# Patient Record
Sex: Female | Born: 1935 | Race: White | Hispanic: No | Marital: Married | Smoking: Never smoker
Health system: Southern US, Community
[De-identification: ages and names within clinical notes are randomized; demographics above are authoritative.]

## PROBLEM LIST (undated history)

## (undated) DIAGNOSIS — I639 Cerebral infarction, unspecified: Secondary | ICD-10-CM

## (undated) DIAGNOSIS — I099 Rheumatic heart disease, unspecified: Secondary | ICD-10-CM

## (undated) DIAGNOSIS — C189 Malignant neoplasm of colon, unspecified: Secondary | ICD-10-CM

## (undated) DIAGNOSIS — M1990 Unspecified osteoarthritis, unspecified site: Secondary | ICD-10-CM

## (undated) DIAGNOSIS — Z952 Presence of prosthetic heart valve: Secondary | ICD-10-CM

## (undated) DIAGNOSIS — K219 Gastro-esophageal reflux disease without esophagitis: Secondary | ICD-10-CM

## (undated) DIAGNOSIS — I509 Heart failure, unspecified: Secondary | ICD-10-CM

## (undated) DIAGNOSIS — I4891 Unspecified atrial fibrillation: Secondary | ICD-10-CM

## (undated) DIAGNOSIS — G20 Parkinson's disease: Secondary | ICD-10-CM

## (undated) DIAGNOSIS — I10 Essential (primary) hypertension: Secondary | ICD-10-CM

## (undated) DIAGNOSIS — I272 Pulmonary hypertension, unspecified: Secondary | ICD-10-CM

## (undated) HISTORY — DX: Malignant neoplasm of colon, unspecified: C18.9

## (undated) HISTORY — PX: TRANSVERSE COLON RESECTION: SHX6155

## (undated) HISTORY — PX: MITRAL VALVE REPLACEMENT: SHX147

---

## 2007-05-15 ENCOUNTER — Ambulatory Visit: Payer: Self-pay | Admitting: Emergency Medicine

## 2007-05-16 ENCOUNTER — Emergency Department: Payer: Self-pay | Admitting: Emergency Medicine

## 2009-05-23 ENCOUNTER — Ambulatory Visit: Payer: Self-pay | Admitting: Gastroenterology

## 2009-05-30 ENCOUNTER — Ambulatory Visit: Payer: Self-pay | Admitting: Gastroenterology

## 2011-06-12 ENCOUNTER — Ambulatory Visit: Payer: Self-pay | Admitting: Internal Medicine

## 2011-06-12 LAB — CBC CANCER CENTER
Basophil %: 0 %
Eosinophil #: 0.1 x10 3/mm (ref 0.0–0.7)
Eosinophil %: 2.5 %
HCT: 32.9 % — ABNORMAL LOW (ref 35.0–47.0)
Lymphocyte #: 0.6 x10 3/mm — ABNORMAL LOW (ref 1.0–3.6)
Lymphocyte %: 18.2 %
MCH: 29.3 pg (ref 26.0–34.0)
MCHC: 32.5 g/dL (ref 32.0–36.0)
MCV: 90 fL (ref 80–100)
Monocyte #: 0.4 x10 3/mm (ref 0.0–0.7)
Neutrophil #: 2.4 x10 3/mm (ref 1.4–6.5)
Neutrophil %: 69.1 %
RBC: 3.66 10*6/uL — ABNORMAL LOW (ref 3.80–5.20)
WBC: 3.5 x10 3/mm — ABNORMAL LOW (ref 3.6–11.0)

## 2011-06-12 LAB — SEDIMENTATION RATE: Erythrocyte Sed Rate: 46 mm/hr — ABNORMAL HIGH (ref 0–30)

## 2011-06-12 LAB — FOLATE: Folic Acid: 18.7 ng/mL (ref 3.1–100.0)

## 2011-06-12 LAB — RETICULOCYTES
Absolute Retic Count: 0.089 10*6/uL — ABNORMAL HIGH (ref 0.024–0.084)
Reticulocyte: 2.4 % — ABNORMAL HIGH (ref 0.5–1.5)

## 2011-06-12 LAB — IRON AND TIBC
Iron Saturation: 93 %
Unbound Iron-Bind.Cap.: 26 ug/dL

## 2011-06-13 LAB — URINE IEP, RANDOM

## 2011-06-13 LAB — PROT IMMUNOELECTROPHORES(ARMC)

## 2011-06-25 ENCOUNTER — Ambulatory Visit: Payer: Self-pay | Admitting: Internal Medicine

## 2011-07-25 ENCOUNTER — Ambulatory Visit: Payer: Self-pay | Admitting: Internal Medicine

## 2011-09-13 ENCOUNTER — Other Ambulatory Visit: Payer: Self-pay | Admitting: Rheumatology

## 2011-09-18 LAB — CULTURE, BLOOD (SINGLE)

## 2011-09-20 ENCOUNTER — Ambulatory Visit: Payer: Self-pay | Admitting: Internal Medicine

## 2011-09-20 LAB — CBC CANCER CENTER
Eosinophil #: 0.1 x10 3/mm (ref 0.0–0.7)
Eosinophil %: 0.8 %
MCH: 26.7 pg (ref 26.0–34.0)
Monocyte %: 9.5 %
Platelet: 440 x10 3/mm (ref 150–440)
RBC: 3.58 10*6/uL — ABNORMAL LOW (ref 3.80–5.20)
RDW: 15.3 % — ABNORMAL HIGH (ref 11.5–14.5)

## 2011-09-20 LAB — RETICULOCYTES
Absolute Retic Count: 0.075 10*6/uL (ref 0.024–0.084)
Reticulocyte: 2.1 % — ABNORMAL HIGH (ref 0.5–1.5)

## 2011-09-20 LAB — IRON AND TIBC
Iron Saturation: 7 %
Iron: 18 ug/dL — ABNORMAL LOW (ref 50–170)

## 2011-09-21 LAB — PROT IMMUNOELECTROPHORES(ARMC)

## 2011-09-24 ENCOUNTER — Ambulatory Visit: Payer: Self-pay | Admitting: Internal Medicine

## 2011-10-08 LAB — OCCULT BLOOD X 1 CARD TO LAB, STOOL
Occult Blood, Feces: NEGATIVE
Occult Blood, Feces: NEGATIVE

## 2011-10-25 ENCOUNTER — Ambulatory Visit: Payer: Self-pay | Admitting: Internal Medicine

## 2011-11-02 LAB — CBC CANCER CENTER
Basophil #: 0 x10 3/mm (ref 0.0–0.1)
Eosinophil #: 0.1 x10 3/mm (ref 0.0–0.7)
Lymphocyte %: 15.8 %
MCHC: 32.7 g/dL (ref 32.0–36.0)
Monocyte %: 7.4 %
Neutrophil %: 73.1 %
Platelet: 362 x10 3/mm (ref 150–440)
RDW: 21.6 % — ABNORMAL HIGH (ref 11.5–14.5)
WBC: 4.8 x10 3/mm (ref 3.6–11.0)

## 2011-11-02 LAB — IRON AND TIBC
Iron Bind.Cap.(Total): 287 ug/dL (ref 250–450)
Iron: 36 ug/dL — ABNORMAL LOW (ref 50–170)
Unbound Iron-Bind.Cap.: 251 ug/dL

## 2011-11-25 ENCOUNTER — Ambulatory Visit: Payer: Self-pay | Admitting: Internal Medicine

## 2012-01-03 ENCOUNTER — Ambulatory Visit: Payer: Self-pay | Admitting: Internal Medicine

## 2012-01-03 LAB — CBC CANCER CENTER
Eosinophil #: 0.1 x10 3/mm (ref 0.0–0.7)
Eosinophil %: 1.7 %
HCT: 32.4 % — ABNORMAL LOW (ref 35.0–47.0)
HGB: 10.3 g/dL — ABNORMAL LOW (ref 12.0–16.0)
Lymphocyte %: 13.7 %
MCHC: 31.9 g/dL — ABNORMAL LOW (ref 32.0–36.0)
Monocyte %: 10.1 %
Neutrophil %: 73.8 %

## 2012-01-03 LAB — IRON AND TIBC
Iron Bind.Cap.(Total): 292 ug/dL (ref 250–450)
Iron Saturation: 16 %
Unbound Iron-Bind.Cap.: 246 ug/dL

## 2012-01-03 LAB — FERRITIN: Ferritin (ARMC): 35 ng/mL (ref 8–388)

## 2012-01-25 ENCOUNTER — Ambulatory Visit: Payer: Self-pay | Admitting: Internal Medicine

## 2012-03-27 ENCOUNTER — Ambulatory Visit: Payer: Self-pay | Admitting: Internal Medicine

## 2012-03-27 LAB — CBC CANCER CENTER
Basophil #: 0 x10 3/mm (ref 0.0–0.1)
Eosinophil #: 0.1 x10 3/mm (ref 0.0–0.7)
Eosinophil %: 1.4 %
HCT: 29.7 % — ABNORMAL LOW (ref 35.0–47.0)
HGB: 9.9 g/dL — ABNORMAL LOW (ref 12.0–16.0)
Lymphocyte #: 0.6 x10 3/mm — ABNORMAL LOW (ref 1.0–3.6)
MCH: 30.3 pg (ref 26.0–34.0)
MCHC: 33.5 g/dL (ref 32.0–36.0)
MCV: 90 fL (ref 80–100)
Neutrophil #: 3.2 x10 3/mm (ref 1.4–6.5)
Platelet: 244 x10 3/mm (ref 150–440)
RDW: 18.1 % — ABNORMAL HIGH (ref 11.5–14.5)
WBC: 4.3 x10 3/mm (ref 3.6–11.0)

## 2012-03-27 LAB — IRON AND TIBC
Iron: 75 ug/dL (ref 50–170)
Unbound Iron-Bind.Cap.: 174 ug/dL

## 2012-03-27 LAB — FERRITIN: Ferritin (ARMC): 49 ng/mL (ref 8–388)

## 2012-04-26 ENCOUNTER — Ambulatory Visit: Payer: Self-pay | Admitting: Internal Medicine

## 2012-06-19 ENCOUNTER — Ambulatory Visit: Payer: Self-pay | Admitting: Internal Medicine

## 2012-06-19 LAB — CBC CANCER CENTER
Basophil #: 0 x10 3/mm (ref 0.0–0.1)
Basophil %: 0.5 %
Eosinophil #: 0.1 x10 3/mm (ref 0.0–0.7)
Eosinophil %: 2 %
HGB: 9.6 g/dL — ABNORMAL LOW (ref 12.0–16.0)
Lymphocyte #: 0.6 x10 3/mm — ABNORMAL LOW (ref 1.0–3.6)
Lymphocyte %: 13 %
MCH: 29.8 pg (ref 26.0–34.0)
MCHC: 31.8 g/dL — ABNORMAL LOW (ref 32.0–36.0)
MCV: 94 fL (ref 80–100)
Monocyte %: 8.4 %
Platelet: 255 x10 3/mm (ref 150–440)
RDW: 15.8 % — ABNORMAL HIGH (ref 11.5–14.5)

## 2012-06-19 LAB — FERRITIN: Ferritin (ARMC): 31 ng/mL (ref 8–388)

## 2012-06-19 LAB — IRON AND TIBC
Iron: 49 ug/dL — ABNORMAL LOW (ref 50–170)
Unbound Iron-Bind.Cap.: 218 ug/dL

## 2012-06-24 ENCOUNTER — Ambulatory Visit: Payer: Self-pay | Admitting: Internal Medicine

## 2012-10-04 ENCOUNTER — Inpatient Hospital Stay: Payer: Self-pay | Admitting: Internal Medicine

## 2012-10-04 LAB — URINALYSIS, COMPLETE
Glucose,UR: NEGATIVE mg/dL (ref 0–75)
Ketone: NEGATIVE
Leukocyte Esterase: NEGATIVE
Nitrite: POSITIVE
Ph: 5 (ref 4.5–8.0)
Protein: NEGATIVE
Specific Gravity: 1.011 (ref 1.003–1.030)
Squamous Epithelial: <1

## 2012-10-04 LAB — COMPREHENSIVE METABOLIC PANEL
BUN: 15 mg/dL (ref 7–18)
Bilirubin,Total: 0.4 mg/dL (ref 0.2–1.0)
Calcium, Total: 8.5 mg/dL (ref 8.5–10.1)
EGFR (African American): >60
Osmolality: 278 (ref 275–301)
Potassium: 3.5 mmol/L (ref 3.5–5.1)
SGOT(AST): 20 U/L (ref 15–37)
Sodium: 139 mmol/L (ref 136–145)

## 2012-10-04 LAB — APTT
Activated PTT: 36.8 secs — ABNORMAL HIGH (ref 23.6–35.9)
Activated PTT: 95.5 secs — ABNORMAL HIGH (ref 23.6–35.9)

## 2012-10-04 LAB — CBC
MCH: 30.1 pg (ref 26.0–34.0)
WBC: 3.6 10*3/uL (ref 3.6–11.0)

## 2012-10-04 LAB — PROTIME-INR
INR: 1.4
Prothrombin Time: 17.3 secs — ABNORMAL HIGH (ref 11.5–14.7)

## 2012-10-05 LAB — BASIC METABOLIC PANEL
Anion Gap: 0 — ABNORMAL LOW (ref 7–16)
Chloride: 107 mmol/L (ref 98–107)
Co2: 34 mmol/L — ABNORMAL HIGH (ref 21–32)
EGFR (African American): >60
Glucose: 85 mg/dL (ref 65–99)
Osmolality: 281 (ref 275–301)
Sodium: 141 mmol/L (ref 136–145)

## 2012-10-05 LAB — PROTIME-INR
INR: 1.5
Prothrombin Time: 18.3 secs — ABNORMAL HIGH (ref 11.5–14.7)

## 2012-10-05 LAB — CBC WITH DIFFERENTIAL/PLATELET
Basophil #: 0 10*3/uL (ref 0.0–0.1)
Basophil %: 0.8 %
Eosinophil #: 0.2 10*3/uL (ref 0.0–0.7)
HCT: 33.1 % — ABNORMAL LOW (ref 35.0–47.0)
HGB: 11.2 g/dL — ABNORMAL LOW (ref 12.0–16.0)
Lymphocyte #: 0.8 10*3/uL — ABNORMAL LOW (ref 1.0–3.6)
MCH: 30.4 pg (ref 26.0–34.0)
Monocyte #: 0.5 x10 3/mm (ref 0.2–0.9)
Neutrophil #: 2.7 10*3/uL (ref 1.4–6.5)
Neutrophil %: 65.2 %
Platelet: 215 10*3/uL (ref 150–440)
RBC: 3.67 10*6/uL — ABNORMAL LOW (ref 3.80–5.20)
RDW: 14.3 % (ref 11.5–14.5)
WBC: 4.2 10*3/uL (ref 3.6–11.0)

## 2012-10-05 LAB — LIPID PANEL
Cholesterol: 114 mg/dL (ref 0–200)
HDL Cholesterol: 44 mg/dL (ref 40–60)
Ldl Cholesterol, Calc: 61 mg/dL (ref 0–100)
Triglycerides: 46 mg/dL (ref 0–200)
VLDL Cholesterol, Calc: 9 mg/dL (ref 5–40)

## 2012-10-05 LAB — APTT
Activated PTT: 128.3 secs — ABNORMAL HIGH (ref 23.6–35.9)
Activated PTT: 74 secs — ABNORMAL HIGH (ref 23.6–35.9)

## 2012-10-06 LAB — PLATELET COUNT: Platelet: 212 10*3/uL (ref 150–440)

## 2012-10-06 LAB — APTT: Activated PTT: 77.3 secs — ABNORMAL HIGH (ref 23.6–35.9)

## 2012-10-06 LAB — PROTIME-INR: INR: 1.7

## 2012-10-06 LAB — URINE CULTURE

## 2012-10-06 LAB — HEMOGLOBIN: HGB: 11 g/dL — ABNORMAL LOW (ref 12.0–16.0)

## 2012-10-07 LAB — PROTIME-INR
INR: 1.8
INR: 2
Prothrombin Time: 22.3 secs — ABNORMAL HIGH (ref 11.5–14.7)

## 2012-10-07 LAB — APTT: Activated PTT: 95.2 secs — ABNORMAL HIGH (ref 23.6–35.9)

## 2012-12-25 ENCOUNTER — Ambulatory Visit: Payer: Self-pay | Admitting: Internal Medicine

## 2012-12-25 LAB — CBC CANCER CENTER
Basophil #: 0 x10 3/mm (ref 0.0–0.1)
Eosinophil #: 0.1 x10 3/mm (ref 0.0–0.7)
HCT: 32.2 % — ABNORMAL LOW (ref 35.0–47.0)
HGB: 10.4 g/dL — ABNORMAL LOW (ref 12.0–16.0)
Lymphocyte #: 0.7 x10 3/mm — ABNORMAL LOW (ref 1.0–3.6)
Lymphocyte %: 16.7 %
MCHC: 32.4 g/dL (ref 32.0–36.0)
MCV: 94 fL (ref 80–100)
Monocyte #: 0.4 x10 3/mm (ref 0.2–0.9)
Monocyte %: 10.6 %
Neutrophil #: 2.9 x10 3/mm (ref 1.4–6.5)
Platelet: 264 x10 3/mm (ref 150–440)
RDW: 17.2 % — ABNORMAL HIGH (ref 11.5–14.5)
WBC: 4.2 x10 3/mm (ref 3.6–11.0)

## 2012-12-25 LAB — IRON AND TIBC
Iron Saturation: 42 %
Unbound Iron-Bind.Cap.: 149 ug/dL

## 2012-12-26 LAB — URINE IEP, RANDOM

## 2012-12-29 LAB — PROT IMMUNOELECTROPHORES(ARMC)

## 2013-01-24 ENCOUNTER — Ambulatory Visit: Payer: Self-pay | Admitting: Internal Medicine

## 2013-06-25 ENCOUNTER — Ambulatory Visit: Payer: Self-pay | Admitting: Oncology

## 2013-06-25 LAB — CBC CANCER CENTER
Basophil #: 0 x10 3/mm (ref 0.0–0.1)
Basophil %: 0.5 %
EOS PCT: 2.2 %
Eosinophil #: 0.1 x10 3/mm (ref 0.0–0.7)
HCT: 29.2 % — AB (ref 35.0–47.0)
HGB: 9.4 g/dL — AB (ref 12.0–16.0)
Lymphocyte #: 0.8 x10 3/mm — ABNORMAL LOW (ref 1.0–3.6)
Lymphocyte %: 25.5 %
MCH: 30.6 pg (ref 26.0–34.0)
MCHC: 32.3 g/dL (ref 32.0–36.0)
MCV: 95 fL (ref 80–100)
MONO ABS: 0.3 x10 3/mm (ref 0.2–0.9)
Monocyte %: 9.8 %
Neutrophil #: 1.8 x10 3/mm (ref 1.4–6.5)
Neutrophil %: 62 %
Platelet: 262 x10 3/mm (ref 150–440)
RBC: 3.08 10*6/uL — ABNORMAL LOW (ref 3.80–5.20)
RDW: 15.4 % — ABNORMAL HIGH (ref 11.5–14.5)
WBC: 3 x10 3/mm — ABNORMAL LOW (ref 3.6–11.0)

## 2013-06-25 LAB — IRON AND TIBC
IRON SATURATION: 41 %
IRON: 112 ug/dL (ref 50–170)
Iron Bind.Cap.(Total): 272 ug/dL (ref 250–450)
Unbound Iron-Bind.Cap.: 160 ug/dL

## 2013-06-25 LAB — FERRITIN: Ferritin (ARMC): 28 ng/mL (ref 8–388)

## 2013-07-24 ENCOUNTER — Ambulatory Visit: Payer: Self-pay | Admitting: Internal Medicine

## 2013-07-24 ENCOUNTER — Ambulatory Visit: Payer: Self-pay | Admitting: Oncology

## 2013-10-08 ENCOUNTER — Ambulatory Visit: Payer: Self-pay | Admitting: Internal Medicine

## 2013-10-08 LAB — CBC CANCER CENTER
BASOS ABS: 0 x10 3/mm (ref 0.0–0.1)
Basophil %: 0.4 %
Eosinophil #: 0 x10 3/mm (ref 0.0–0.7)
Eosinophil %: 0.7 %
HCT: 33.2 % — AB (ref 35.0–47.0)
HGB: 10.8 g/dL — ABNORMAL LOW (ref 12.0–16.0)
LYMPHS PCT: 6.8 %
Lymphocyte #: 0.4 x10 3/mm — ABNORMAL LOW (ref 1.0–3.6)
MCH: 30.8 pg (ref 26.0–34.0)
MCHC: 32.4 g/dL (ref 32.0–36.0)
MCV: 95 fL (ref 80–100)
MONOS PCT: 6.9 %
Monocyte #: 0.4 x10 3/mm (ref 0.2–0.9)
Neutrophil #: 4.9 x10 3/mm (ref 1.4–6.5)
Neutrophil %: 85.2 %
PLATELETS: 274 x10 3/mm (ref 150–440)
RBC: 3.5 10*6/uL — AB (ref 3.80–5.20)
RDW: 14.7 % — ABNORMAL HIGH (ref 11.5–14.5)
WBC: 5.8 x10 3/mm (ref 3.6–11.0)

## 2013-10-12 LAB — PROT IMMUNOELECTROPHORES(ARMC)

## 2013-10-12 LAB — KAPPA/LAMBDA FREE LIGHT CHAINS (ARMC)

## 2013-10-24 ENCOUNTER — Ambulatory Visit: Payer: Self-pay | Admitting: Internal Medicine

## 2013-11-18 ENCOUNTER — Encounter: Payer: Self-pay | Admitting: Surgery

## 2013-11-24 ENCOUNTER — Encounter: Payer: Self-pay | Admitting: Surgery

## 2013-12-24 ENCOUNTER — Ambulatory Visit: Payer: Self-pay | Admitting: Internal Medicine

## 2013-12-24 LAB — CBC CANCER CENTER
BASOS ABS: 0 x10 3/mm (ref 0.0–0.1)
Basophil %: 0.7 %
Eosinophil #: 0.1 x10 3/mm (ref 0.0–0.7)
Eosinophil %: 1.4 %
HCT: 32.8 % — AB (ref 35.0–47.0)
HGB: 10.7 g/dL — ABNORMAL LOW (ref 12.0–16.0)
LYMPHS ABS: 0.5 x10 3/mm — AB (ref 1.0–3.6)
Lymphocyte %: 15.4 %
MCH: 30.9 pg (ref 26.0–34.0)
MCHC: 32.7 g/dL (ref 32.0–36.0)
MCV: 94 fL (ref 80–100)
Monocyte #: 0.3 x10 3/mm (ref 0.2–0.9)
Monocyte %: 8.1 %
NEUTROS PCT: 74.4 %
Neutrophil #: 2.6 x10 3/mm (ref 1.4–6.5)
Platelet: 342 x10 3/mm (ref 150–440)
RBC: 3.47 10*6/uL — ABNORMAL LOW (ref 3.80–5.20)
RDW: 15.3 % — ABNORMAL HIGH (ref 11.5–14.5)
WBC: 3.5 x10 3/mm — ABNORMAL LOW (ref 3.6–11.0)

## 2013-12-24 LAB — IRON AND TIBC
Iron Bind.Cap.(Total): 246 ug/dL — ABNORMAL LOW (ref 250–450)
Iron Saturation: 24 %
Iron: 60 ug/dL (ref 50–170)
Unbound Iron-Bind.Cap.: 186 ug/dL

## 2013-12-24 LAB — FERRITIN: Ferritin (ARMC): 34 ng/mL (ref 8–388)

## 2013-12-28 LAB — PROT IMMUNOELECTROPHORES(ARMC)

## 2013-12-29 LAB — URINE IEP, RANDOM

## 2014-01-24 ENCOUNTER — Ambulatory Visit: Payer: Self-pay | Admitting: Internal Medicine

## 2014-04-01 ENCOUNTER — Ambulatory Visit: Payer: Self-pay | Admitting: Internal Medicine

## 2014-04-01 LAB — CBC CANCER CENTER
BASOS ABS: 0 x10 3/mm (ref 0.0–0.1)
Basophil %: 0.7 %
Eosinophil #: 0.1 x10 3/mm (ref 0.0–0.7)
Eosinophil %: 2.1 %
HCT: 31.6 % — ABNORMAL LOW (ref 35.0–47.0)
HGB: 10 g/dL — ABNORMAL LOW (ref 12.0–16.0)
LYMPHS PCT: 16.6 %
Lymphocyte #: 0.8 x10 3/mm — ABNORMAL LOW (ref 1.0–3.6)
MCH: 30.2 pg (ref 26.0–34.0)
MCHC: 31.6 g/dL — ABNORMAL LOW (ref 32.0–36.0)
MCV: 95 fL (ref 80–100)
MONOS PCT: 9.6 %
Monocyte #: 0.4 x10 3/mm (ref 0.2–0.9)
NEUTROS ABS: 3.3 x10 3/mm (ref 1.4–6.5)
Neutrophil %: 71 %
PLATELETS: 292 x10 3/mm (ref 150–440)
RBC: 3.32 10*6/uL — ABNORMAL LOW (ref 3.80–5.20)
RDW: 14.2 % (ref 11.5–14.5)
WBC: 4.6 x10 3/mm (ref 3.6–11.0)

## 2014-04-26 ENCOUNTER — Ambulatory Visit: Payer: Self-pay | Admitting: Internal Medicine

## 2014-07-01 ENCOUNTER — Ambulatory Visit
Admit: 2014-07-01 | Disposition: A | Discharge status: Home / Self Care | Payer: Self-pay | Attending: Internal Medicine | Admitting: Internal Medicine

## 2014-07-01 LAB — IRON AND TIBC
IRON BIND. CAP.(TOTAL): 314 (ref 250–450)
Iron Saturation: 11.4
Iron: 36 ug/dL
Unbound Iron-Bind.Cap.: 278.4

## 2014-07-01 LAB — CBC CANCER CENTER
Basophil #: 0 x10 3/mm (ref 0.0–0.1)
Basophil %: 0.8 %
EOS PCT: 3.6 %
Eosinophil #: 0.1 x10 3/mm (ref 0.0–0.7)
HCT: 30.1 % — ABNORMAL LOW (ref 35.0–47.0)
HGB: 9.7 g/dL — AB (ref 12.0–16.0)
Lymphocyte #: 0.6 x10 3/mm — ABNORMAL LOW (ref 1.0–3.6)
Lymphocyte %: 17 %
MCH: 28.3 pg (ref 26.0–34.0)
MCHC: 32.1 g/dL (ref 32.0–36.0)
MCV: 88 fL (ref 80–100)
MONO ABS: 0.4 x10 3/mm (ref 0.2–0.9)
Monocyte %: 11.1 %
NEUTROS ABS: 2.3 x10 3/mm (ref 1.4–6.5)
NEUTROS PCT: 67.5 %
Platelet: 278 x10 3/mm (ref 150–440)
RBC: 3.42 10*6/uL — ABNORMAL LOW (ref 3.80–5.20)
RDW: 16.1 % — ABNORMAL HIGH (ref 11.5–14.5)
WBC: 3.4 x10 3/mm — ABNORMAL LOW (ref 3.6–11.0)

## 2014-07-01 LAB — FERRITIN: Ferritin (ARMC): 22 ng/mL

## 2014-07-02 LAB — KAPPA/LAMBDA FREE LIGHT CHAINS (ARMC)

## 2014-07-16 NOTE — Consult Note (Signed)
Brief Consult Note: Diagnosis: Heme positive, chroic IDA w Hgb 11 in need of anticoagulation.   Patient was seen by consultant.   Comments: This case was d/w Dr. Vira Agar in collaboration of care. With  a hgb of 11-stable- and no frank rectal bleeding or hematemesis would not let a heme positive test result deter from using anticoagulation. Recommend trying  to keep the INR in the lowest effective range as needed. In the absence of active gi bleed, NO plans for luminal evaluation given poor overall health.  Electronic Signatures: Gershon Mussel (NP)  (Signed 14-Jul-14 13:44)  Authored: Brief Consult Note   Last Updated: 14-Jul-14 13:44 by Gershon Mussel (NP)

## 2014-07-16 NOTE — Discharge Summary (Signed)
PATIENT NAME:  Gwendolyn Norman, Gwendolyn Norman MR#:  [id number] DATE OF BIRTH:  08/18/1931  DATE OF ADMISSION:  10/04/2012 DATE OF DISCHARGE:  10/07/2012  ADMITTING DIAGNOSES:  1.  Cerebrovascular accident. 2.  Malignant hypertension.   DISCHARGE DIAGNOSES:  1.  Hypertensive encephalopathy versus transient ischemic attack with isolated left eye third nerve palsy, resolved.  2.  History of lazy eye.  3.  Malignant hypertension.  4.  Urinary tract infection. 5.  Anemia, guaiac positive.  6.  Chronic gastrointestinal blood loss.  7.  History of atrial fibrillation, mechanical mitral valve, on Coumadin therapy.  8.  Hematuria.   DISCHARGE CONDITION:  Stable.   DISCHARGE MEDICATIONS:  The patient is to continue metoprolol tartrate 50 mg by p.o. twice daily, amlodipine 5 mg p.o. daily, vitamin B12 1000 mcg p.o. daily, Tylenol 500 mg p.o. daily as needed, Coumadin 5 mg 1/2 tablet daily, iron sulfate 325 mg p.o. 3 times daily, folic acid 1 mg p.o. once daily, calcium 600 with vitamin D 200 units 1 tablet twice daily, hydrochloroquine 200 mg p.o. daily, Lasix 20 mg p.o. daily, lisinopril 5 mg p.o. twice daily, pantoprazole 40 mg p.o. twice daily, ciprofloxacin 500 mg p.o. twice daily for 2 more days.   HOME OXYGEN:  None.   DIET:  2 gram salt, low fat, low cholesterol, regular consistency.   ACTIVITY LIMITATIONS:  As tolerated.    FOLLOW-UP APPOINTMENT:  With Dr. Ilene Qua in 2 days after discharge.  The patient was advised also to have Coumadin level checked in the next 2 or 3 days after discharge.   CONSULTANTS:  Care management, Dr. Vira Agar.  Ms. Denice Paradise, nurse practitioner.   RADIOLOGIC STUDIES:  Chest, portable single view, 10/04/2012, revealed massive cardiomegaly with possible mild interstitial edema versus fibrosis.  Follow-up PA and lateral chest views were recommended.  CT scan of head without contrast 10/04/2012 showed a right basal ganglia lacunar infarct in the thalamus.  No acute  intracranial abnormality evident.  MRI of brain without contrast 10/04/2012 revealed atrophy with chronic microvascular ischemic disease.  No acute intracranial abnormality.  Old right thalamic lacunar infarct present.  Carotid ultrasound, bilateral, 10/04/2012 revealed atherosclerotic disease without evidence of hemodynamically significant stenosis.  Echocardiogram 10/04/2012 showed a left ventricular ejection fraction by visual estimation 55% to 60%, normal global left ventricular systolic function, mild concentric left ventricular hypertrophy, mildly increased left ventricular septal thickness, moderately dilated left atrium, mildly dilated right atrium, mild mitral valve regurgitation, mitral prosthesis is functioning normally, mild aortic regurgitation, mild aortic valve sclerosis without stenosis, mildly elevated pulmonary arterial systolic pressure, mildly increased left ventricular posterior wall thickness, mild tricuspid regurgitation.   HOSPITAL COURSE:   1.  The patient is a 81 year old Caucasian female with past medical history significant for history of mitral valve replacement, history of other medical problems including anemia of chronic disease, iron deficiency anemia, rheumatoid arthritis, history of MGUS, leukopenia, presents to the hospital with complaints of inability to walk and feeling dizzy, lightheaded as well as having vision problems, double vision, especially when she looks to the right side.  Please refer to Dr. Keenan Bachelor admission note on 10/04/2012.  On arrival to the Emergency Room, the patient's vitals revealed normal temperature of 98.3, pulse was 75, respiration rate was 20, blood pressure was 172/93, O2 sats were 100% on room air.  Physical exam revealed left eye 3rd nerve palsy.  The patient would have skew deviation whenever she would look to the right; her left eye would not cross  the midline and would not converge.    She was admitted to the hospital with diagnosis of  stroke and stroke workup was initiated.  It was felt that patient very likely had stroke, however MRI did not show a stroke so it was felt that the patient could have had a TIA as the patient's symptoms really subsided in the next 24 hours or hypertensive encephalopathy.  With blood pressure management, the patient's condition significantly improved.  The patient was evaluated by speech therapist as well as a physical therapist who recommended home health physical therapy.  The patient will be discharged home with Coumadin and her lipid panel was checked to rule out any other reasons for stroke.  Lipid panel revealed an LDL of 61, total cholesterol of 114, triglycerides 46 and HDL of 44.  The patient was not initiated on lipid lowering medications at this point because of concern of possible hemorrhagic conversion of stroke when the patient's LDL is very low.  According to new recommendations, this lipid management is not recommended for patients with low LDL.  She is, however, to follow up with her primary care physician and make decisions about the medications if needed.  While in the hospital she was loaded with Coumadin.  Initially it was thought that the patient had an embolic phenomenon as her INR was low.  On arrival to the Emergency Room, the patient's pro time was only 17.3 and INR was 1.4.  She was initiated on heparin IV and was loaded with Coumadin.  On day of discharge, the patient's pro time is 22.3 and INR 2.0.  She is given 5 more milligrams of Coumadin prior to leaving the hospital.  She is to resume her usual home management of her Coumadin, and she is to follow up with her primary care physician and have her Coumadin checked in the next few days after discharge.   2.  Regarding her malignant hypertension, the patient's blood pressure was poorly controlled while she was in the hospital; however adjusting her blood pressure medications got her blood pressure well controlled.  On the day of  discharge, patient's vital signs, temperature is 98.4, pulse ranging from 60s to 70s, respiratory rate was 16 to 20, blood pressure 116/63, saturation was 96% to 97% on room air at rest.  It was felt that the patient's blood pressure management has a lot to do with her encephalopathy, however TIA was not completely ruled out.   3.  The patient was also noted to have urinary tract infection.  E. coli was growing in her urine cultures, more than 100,000 colony-forming units.  The patient's E. coli was susceptible to all antibiotics.  She was initiated on ciprofloxacin.  She is to continue ciprofloxacin for 2 more days to complete a 3-day course.  4.  In regards to anemia, the patient was noted to be guaiac positive.  It was felt that the patient very likely has some chronic GI blood loss as well as blood loss with hematuria.  Gastroenterologist was consulted, however he recommended no intervention.  Iron supplementation was recommended for her upon discharge.  5.  In regards to atrial fibrillation as well as mechanical valve, as mentioned above, the patient's Coumadin was very low, level was only 1.4 on admission.  The patient was given heparin as well as Coumadin bridge while she was in the hospital, and her Coumadin level on the day of discharge is therapeutic or close to therapeutic at 2.0.  Of note, Dr.  Vira Agar felt that the patient would benefit from lower range and tighter range of INR control to avoid bleeding; however he recommended no plans for EGD or colposcopy.   TIME SPENT:  40 minutes.    ____________________________ Theodoro Grist, MD rv:ea D: 10/07/2012 21:38:00 ET T: 10/08/2012 00:03:04 ET JOB#: [id number]  cc: Fonnie Jarvis. Ilene Qua, MD Theodoro Grist, MD, <Dictator>    [place] MD ELECTRONICALLY SIGNED 10/16/2012 11:44

## 2014-07-16 NOTE — Consult Note (Signed)
PATIENT NAME:  Gwendolyn Norman, Gwendolyn Norman MR#:  [id number] DATE OF BIRTH:  1931/04/15  DATE OF CONSULTATION:  10/06/2012  REFERRING PHYSICIAN:  Theodoro Grist, MD   CONSULTING PHYSICIAN:  Gaylyn Cheers, MD/Chayna Surratt A. Jerelene Redden, ANP   REASON FOR CONSULTATION: Heme-positive stool on chronic anticoagulation with warfarin.   HISTORY OF PRESENT ILLNESS: This 81 year old patient of Dr. Ilene Qua presented to the hospital for blurred vision, dizziness, and this occurred yesterday afternoon when she got up from the couch. She felt like she was leaning and walking toward her right side. She was having headache. The patient was concerned about possibility of stroke. She presented to the Emergency Room and came in with an INR subtherapeutic at 1.4. She had been taking Coumadin 2.5 mg 3 times a week as well as 5 mg twice a week for her history of CVA and [employer]. Jude's mitral valve replacement.  In the Emergency Room, she did have a CT scan of the head without contrast on 10/04/2012 that showed right basal ganglia lacunar infarct in the thalamus. No acute intracranial abnormalities were noted. She was found to be Hemoccult positive. She has chronic iron deficiency anemia and has been on oral iron therapy and followed for IV iron supplementation by Dr. Ma Hillock for the last 2 years. Her baseline hemoglobin that is considered "good" when it is in the 10 range. The patient presents with a hemoglobin of 11. There is a request from GI for opinion on whether the patient can be continued on her chronic anticoagulation therapy in this setting of a Hemoccult positive stool and absence of any GI complaints or overt evidence of GI blood loss. The patient reports her stools are chronically black because she has been on 3 iron tablets a day, recently decreased to 2 tablets daily.    This patient reports she has been offered a colonoscopy in the past and declined. She did agree to a barium enema study and upper GI barium study 05/23/2009 that showed  tertiary contractions involving the mid and distal esophagus likely secondary to a presbyesophagus versus spasm. The barium enema was performed 05/30/2009 with air contrast and it showed no stenosis or abnormality. There was an incompetent ileocecal valve. The patient can recall a remote flexible sigmoidoscopy over 10 years ago. She has been maintained on iron therapy and is on omeprazole 20 mg once daily. The patient denies any upper or lower GI complaints and reports normal diet, appetite and weight.   PAST MEDICAL HISTORY: 1.  Atrial fibrillation.  2.  Rheumatic fever with mitral valve replacement. Initial valve surgery was 1974.  3.  CVA.  4.  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  5.  Peripheral vascular disease.  6.  Anemia and leukopenia followed by Dr. Ma Hillock. 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  7.  Esophageal tertiary contractions, presbyesophagus per upper GI barium study.  8.  Pulmonary hypertension.  9.  GERD.  PAST SURGICAL HISTORY: 1.  St. Jude's mitral valve replacement in 1992.  2.  Tonsillectomy.  3.  History of breast biopsy, negative for cancer.   MEDICATIONS ON ARRIVAL: According to patient records:  1.  Coumadin.  2.  Amlodipine 5 mg daily.  3.  Metoprolol 50 mg twice daily.  4.  Hydroxychloroquine 200 mg daily.  5.  Folic acid 1 daily.  6.  Vitamin B12 1000 mcg daily.  7.  Iron 325 mg twice daily.  8.  Furosemide 20 mg daily.  9.  Calcium with vitamin D  twice daily.  10.  Omeprazole 20 mg once daily.   ALLERGIES:  1.  DOXYCYCLINE. 2.  PENICILLIN.   REVIEW OF SYSTEMS: Positive for weakness and fatigue, headache on the left side, and weight loss reported; however, [facility] weight was 129 in 2013 and 132 05/2012.  No evidence of weight loss by clinic measurement. The patient reports normal appetite, diet and reports stable weight. She denies any abdominal pain. She has occasional heart palpitation and asymptomatic now. She has  chronic arthritis, reports that is stable. Remaining review of systems x 10 negative.   PHYSICAL EXAMINATION:  VITAL SIGNS: Temp 98.3 to 99.3, 62, 139/78, pulse oximetry room air is 96%.   GENERAL: A well-appearing elderly Caucasian female resting in bed.  HEENT: Head is normocephalic. Conjunctivae pink. Sclerae are anicteric. Oral mucosa is moist and intact.  NECK: Supple without lymphadenopathy, thyromegaly.  CARDIAC: S1, S2 without murmur, rub or gallop.  RESPIRATIONS: S1, S2 with metallic click, mitral and positive murmur consistent with heart history. Very prominent murmur. No arrhythmia noted.  LUNGS: CTA. Respirations are eupneic.  ABDOMEN: Soft. Bowel sounds are present in all quadrants. Nontender in all quadrants without hepatosplenomegaly.  RECTAL: Exam is deferred.  MUSCULOSKELETAL: No obvious joint swelling or inflammation, movement of extremities x 4. Gait not evaluated.  SKIN: Warm and dry without rash, venous stasis changes, scant, if any, edema.  Te patient reports she is at baseline.  NEUROLOGIC: Cranial nerves are grossly intact. Speech is clear, grasp is equal bilateral. She is a good historian. Gait not evaluated.  PSYCHIATRIC: Affect and mood within normal, alert and oriented x 3.   LABORATORY AND RADIOLOGICAL DATA:  Today with unremarkable MET-B.  Hemoglobin 11.2 to 11.0, normal platelet count 215, MCV 90, pro time is 18.3, INR 1.5, PTT 128.3. Occult positive.  Routine stool study.  Hemoccult positive card.  Urinalysis is positive for RBCs, WBCs, and culture. Grew E. coli greater than 100,000.  The patient was placed on oral ciprofloxacin with positive sensitivity.   RADIOLOGY: Ultrasound of carotids obtained showing no evidence of hemodynamically significant stenosis. MRI of the brain showed atrophy with chronic microvascular ischemic disease, no acute intracranial abnormality.  Old right thalamic lacunar infarct is present.  CT of the head without contrast showed right  basal ganglia lacunar infarct in the thalamus. No acute intracranial abnormality present. No intracranial hemorrhage, mass or mass effect or midline shift.   IMPRESSION: This is a patient with chronic iron deficiency anemia and abnormal T cell line, monoclonal gammopathy followed by Dr. Ma Hillock. The patient has baseline hemoglobin in the 10 range, and she reports that as long as she stays above 10 Dr. Ma Hillock has been happy. She has had a decrease in her oral iron from 3 tablets daily to 2 tablets daily. She has been receiving IV iron for the last 2 years. She reports no upper or lower GI complaints. Hemoccult was positive. She has had upper GI and barium enema study performed in 2012. The patient had declined colonoscopy as she says she was fearful about bowel perforation and declined. The patient offers no GI complaints at this time. She does have a history of stroke work-up in process and presents on chronic anticoagulation with history of CVA and St. Jude's mitral valve replacement.   PLAN: The absence of active GI bleed with an excellent hemoglobin of 11 for her, there  is no reason to deter her from getting her chronic anticoagulation. Dr. Vira Agar does recommend trying to keep  the INR in the lowest effective range as needed, but he gives permission to use the Coumadin. The patient reports her hemoglobin is followed by Dr. Ilene Qua as an outpatient, and she does not have need for repeat follow up with Dr. Ma Hillock for 1 year. No recommendation for luminal evaluation at this time. This case was discussed with Dr. Vira Agar in collaboration of care. Thank you for the consultation.   These services provided by Joelene Millin A. Jerelene Redden, MS, APRN, BC, ANP, under  collaborative agreement with Manya Silvas, MD.   ____________________________ Janalyn Harder Jerelene Redden, ANP (Adult Nurse Practitioner) kam:cb D: 10/06/2012 18:01:43 ET T: 10/06/2012 20:48:02 ET JOB#: [id number]  cc: Joelene Millin A. Jerelene Redden, ANP (Adult Nurse  Practitioner), <Dictator> Fonnie Jarvis Ilene Qua, MD Janalyn Harder Sherlyn Hay, MSN, ANP-BC Adult Nurse Practitioner ELECTRONICALLY SIGNED 10/07/2012 12:28

## 2014-07-16 NOTE — H&P (Signed)
PATIENT NAME:  Gwendolyn Norman, Gwendolyn Norman MR#:  [id number] DATE OF BIRTH:  1931-04-15  DATE OF ADMISSION:  10/04/2012  PRIMARY CARE PHYSICIAN: Eulas Post R. Ilene Qua, MD  HISTORY OF PRESENT ILLNESS: The patient is a 81 year old Caucasian female with past medical history significant for history of multiple medical problems, including history of anemia of chronic medical disease, iron deficiency anemia, severe rheumatoid arthritis, history of MGUS, leukopenia, rheumatic fever and mitral valve repair in 1992 with St. Jude's valve, on Coumadin therapy at home, who presents to the hospital with complaints of dizziness, lightheadedness as well as vision problems. According to the patient, she was doing well up until yesterday afternoon, when she tried to get up from the couch, and she became suddenly dizzy. Her vision got blurry. The patient had double vision as well. She also had a feeling like she is leaning to the right side. She is also complaining of back of the head on the left side pain. The pain is described as achiness, as high as 8 out of 10 intensity, constant. It would last for a while, and then it would subside. This time, it has been there for a while. The patient has been having those symptoms for a while now, for the past few years. They would come just for a few seconds, and then they would go away, but usually are a few major symptoms, including vision disturbance as well as headaches, occipital pains in the left side of her head as well as dizziness, lightheadedness. She told her primary care physician; however, since she had no symptoms when she was seen by physician, no other interventions were performed. She also admits to having some dizziness in the past with no visual disturbances. She says that whenever she drives, she feels like she is falling. She is more lightheaded. She has been taking Coumadin 2.5 mg 3 times a week as well as 5 mg twice a week therapy; however, her INR is subtherapeutic at 1.4 here in  the hospital. Hospitalist services were contacted for admission as the patient was noted to have left eye third nerve palsy.   PAST MEDICAL HISTORY: Significant for:  1. History of anemia of chronic disease.  2. Iron deficiency anemia. 3. History of rheumatoid arthritis, followed by Dr. Jefm Bryant. 4. History of MGUS with 0.5 grams/dL protein in March 2013.  5. History of leukopenia.  6. History of rheumatic fever with mitral valve repair in 1992.  7. History of CVA.  8. Hypertension. 9. Hematuria secondary to ureteral stenosis. 10. Breast biopsy, no cancer. 11. Tonsillectomy. 12. Status post St. Jude's mitral valve replacement in 1992.  FAMILY HISTORY: The patient's mother had head and neck cancer. No hematological disorders.   SOCIAL HISTORY: The patient is a nonsmoker. Denies alcohol abuse. She is fairly active as well as ambulatory. The patient was widowed 2 years ago in June. She just retired from [employer].  MEDICATIONS: According to medical records, the patient is on:  1. Coumadin 2.5 mg p.o. 3 times a week and 5 mg 2 times a week.  2. Amlodipine 5 mg p.o. daily.  3. Metoprolol 50 mg p.o. twice daily.  4. Hydroxychloroquine 200 mg p.o. daily.  5. Folic acid 1 mg once daily.  6. Vitamin B12 1000 mcg p.o. daily. 7. Iron twice a day 325 mg. 8. Furosemide 20 mg once daily.  9. Calcium with vitamin D twice a day.  ALLERGIES: THE PATIENT IS ALLERGIC TO DOXYCYCLINE AS WELL AS PENICILLIN.   REVIEW  OF SYSTEMS:  GENERAL: Positive for feeling fatigued and weak for the past at least 1 week, feeling chilly, also having some pains in the head, mostly in left side occipital area, weight loss, approximately 30 pounds in the past 2 years, blurry vision, double vision, also sinus congestion. Has some chest palpitations intermittently, also constipation for which she uses prune juice, some home remedies. Right knee pain/arthritis, ankle numbness as well as discomfort in her feet due to  swelling, right arm heaviness intermittently once in 6 months, associated with prior work.  CONSTITUTIONAL: Denies any fevers, weight gain. EYES: Denies glaucoma or cataracts.  EARS, NOSE, THROAT: Denies any tinnitus, allergies, epistaxis, sinus tenderness, difficulty swallowing.  RESPIRATORY: Denies any wheezes, asthma, COPD.  CARDIOVASCULAR: Denies any chest pains, orthopnea, edema or syncope.  GASTROINTESTINAL: Denies any nausea, vomiting, diarrhea, abdominal pains, change in bowel habits.  GENITOURINARY: Denies dysuria, hematuria, frequency, incontinence.  ENDOCRINOLOGY: Denies any polydipsia, nocturia, thyroid problems, heat or cold intolerance or thirst.  HEMATOLOGIC: Denies anemia, easy bruising or bleeding or swollen glands.  SKIN: Denies any acne, rashes, change in moles.  MUSCULOSKELETAL: Denies arthritis, cramps, swelling, gout.  NEUROLOGIC: No numbness, epilepsy or tremors.  PSYCHIATRIC: : Denies anxiety, insomnia, depression.   PHYSICAL EXAMINATION:  VITAL SIGNS: On arrival to the hospital, temperature is 98.3, pulse was 75, respirations were 20, blood pressure 172/93, saturation was 100% on room air.  GENERAL: This is a well-developed, well-nourished Caucasian female in no significant distress, sitting on the stretcher.  HEENT: Pupils equally reactive to light. The patient's extraocular movements were intact on the right side; however, her left eye does not go beyond midline. She has also difficulty accommodating. No conjunctivitis, difficulty hearing or pharyngeal erythema. Mucosa is moist.  NECK:  No masses, supple, nontender. Thyroid is not enlarged. No adenopathy. No JVD or carotid bruits bilaterally. Full range of motion.  LUNGS: Clear to auscultation in all fields. No rales, rhonchi, diminished breath sounds or wheezing. No labored inspirations, increased effort, dullness to percussion or overt respiratory distress.  CARDIOVASCULAR: S1, S2 appreciated. The patient does have a  metallic click murmur noted on the mitral auscultation site with mild murmur, approximately 2/6 systolic murmur was heard. PMI not lateralized. Chest wall is nontender to palpation, 1+ pedal pulses. Trace lower extremity edema. No calf tenderness or cyanosis was noted.  ABDOMEN: Soft, nontender. Bowel sounds are present. No hepatosplenomegaly or masses were noted.  RECTAL: Deferred.  MUSCULOSKELETAL: Able to move all extremities. No cyanosis, degenerative joint disease or kyphosis. Gait is not tested.  SKIN: Did not reveal any rashes or lesions; however, the patient does have some brownish discoloration in her lower extremities which is consistent with chronic venous stasis skin changes. Skin was warm and dry to palpation.  LYMPHATIC: No adenopathy in the cervical region.  NEUROLOGIC: Cranial nerves grossly intact. Sensory is intact. No dysarthria or aphasia. The patient is alert, oriented to person and place, cooperative. Memory is good.  PSYCHIATRIC: No significant confusion, agitation or depression noted.   LABORATORY DATA: BMP was within normal limits. Liver enzymes showed albumin level of 3.0, otherwise normal. CBC: White blood cell count was 3.6, hemoglobin 11.1, platelet count was 219. Coagulation panel: Pro time was 17.3, INR was 1.4 and activated PTT was 36.8. Urinalysis: Yellow clear urine, negative for glucose, bilirubin or ketones. Specific gravity 1.011, pH was 5.0, 2+ blood, negative for protein, positive for nitrites, negative for leukocyte esterase, 4 red blood cells, 2 white blood cells, trace bacteria, less  than 1 epithelial cell, and mucus was present. EKG showed atrial fibrillation at a rate of 66 beats per minute with left axis deviation. No acute ST-T changes were noted. No EKG to compare with.   RADIOLOGIC STUDIES: Chest x-ray, portable single view, on 10/04/2012 showed (Dictation Anomaly) massive cardiomegaly  with possible mild interstitial edema versus fibrosis. Followup PA and  lateral would be of value, according to radiologist. CT scan of head without contrast on 10/04/2012 showed right basal ganglia lacunar infarct in thalamus. No acute intracranial abnormalities were noted.   ASSESSMENT AND PLAN:  1. 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 Will admit the patient to medical floor. Start her on heparin IV due to concern for possible embolic phenomenon. Continue Coumadin therapy, advance it to have INR of 2.5 and above. Will also start the patient on low dose of aspirin. Will follow INR. Will get carotid ultrasound, echocardiogram as well as lipid panel in the morning.  2. Malignant hypertension. Will advance medications. Will add lisinopril if the patient's MRI of brain does not show acute stroke.  3. Atrial fibrillation with mechanical mitral valve. Will advance Coumadin with INR of 2.5 and above, following level. Will also continue heparin for now as bridge.  4. Anemia. Get guaiac.  5. Microscopic hematuria. Get cultures. Will start antibiotic therapy if cultures are positive for infection.  6. History of anemia of chronic disease. Will continue the patient on iron supplements. 7. Leukopenia, seems to be stable.   TIME SPENT: 50 minutes.   ____________________________ Theodoro Grist, MD rv:OSi D: 10/04/2012 10:50:12 ET T: 10/04/2012 12:22:58 ET JOB#: [id number]  cc: Theodoro Grist, MD, <Dictator> Fonnie Jarvis. Ilene Qua, MD Theodoro Grist MD ELECTRONICALLY SIGNED 10/16/2012 11:40

## 2014-07-16 NOTE — Consult Note (Signed)
CC TIA pt without bleeding, without abd pain, ate all of meals.  INR was 1.8, now is 2.0.  Still on heparin drip.  no plans to do EGD at this time, nor colononscopy.  Recommend tight range on INR to avoid bleeding.  Electronic Signatures: Manya Silvas (MD)  (Signed on 15-Jul-14 17:09)  Authored  Last Updated: 15-Jul-14 17:09 by Manya Silvas (MD)

## 2014-07-16 NOTE — Consult Note (Signed)
Pt CC heme positive stool with neurologic changes.  She is feeling much better now than admission.  On eye exam her eyes cross the midline, Her left eye can cross the midline now.  i would not let the heme positive stool deter or prevent anticoagulation to treat her acute event.  Would cover with PPI at least once  a day.  Electronic Signatures: Manya Silvas (MD)  (Signed on 14-Jul-14 19:26)  Authored  Last Updated: 14-Jul-14 19:26 by Manya Silvas (MD)

## 2014-08-18 ENCOUNTER — Ambulatory Visit
Admission: RE | Admit: 2014-08-18 | Discharge: 2014-08-18 | Disposition: A | Discharge status: Home / Self Care | Payer: Medicare Other | Source: Ambulatory Visit | Attending: Rheumatology | Admitting: Rheumatology

## 2014-08-18 ENCOUNTER — Other Ambulatory Visit
Admission: RE | Admit: 2014-08-18 | Discharge: 2014-08-18 | Disposition: A | Discharge status: Home / Self Care | Payer: Medicare Other | Source: Other Acute Inpatient Hospital | Attending: Rheumatology | Admitting: Rheumatology

## 2014-08-18 ENCOUNTER — Other Ambulatory Visit: Payer: Self-pay | Admitting: Rheumatology

## 2014-08-18 DIAGNOSIS — M7989 Other specified soft tissue disorders: Secondary | ICD-10-CM

## 2014-08-18 DIAGNOSIS — M79604 Pain in right leg: Secondary | ICD-10-CM

## 2014-08-18 DIAGNOSIS — R609 Edema, unspecified: Secondary | ICD-10-CM | POA: Insufficient documentation

## 2014-08-18 LAB — BRAIN NATRIURETIC PEPTIDE: B Natriuretic Peptide: 242 pg/mL — ABNORMAL HIGH (ref 0.0–100.0)

## 2014-09-28 ENCOUNTER — Other Ambulatory Visit: Payer: Self-pay

## 2014-09-28 ENCOUNTER — Inpatient Hospital Stay: Payer: Medicare Other | Attending: Internal Medicine

## 2014-09-28 DIAGNOSIS — D638 Anemia in other chronic diseases classified elsewhere: Secondary | ICD-10-CM

## 2014-09-28 DIAGNOSIS — D472 Monoclonal gammopathy: Secondary | ICD-10-CM | POA: Insufficient documentation

## 2014-09-28 DIAGNOSIS — Z8673 Personal history of transient ischemic attack (TIA), and cerebral infarction without residual deficits: Secondary | ICD-10-CM | POA: Diagnosis not present

## 2014-09-28 DIAGNOSIS — D72819 Decreased white blood cell count, unspecified: Secondary | ICD-10-CM | POA: Diagnosis not present

## 2014-09-28 DIAGNOSIS — M069 Rheumatoid arthritis, unspecified: Secondary | ICD-10-CM | POA: Insufficient documentation

## 2014-09-28 DIAGNOSIS — Z79899 Other long term (current) drug therapy: Secondary | ICD-10-CM | POA: Insufficient documentation

## 2014-09-28 DIAGNOSIS — D509 Iron deficiency anemia, unspecified: Secondary | ICD-10-CM | POA: Diagnosis not present

## 2014-09-28 LAB — IRON AND TIBC
Iron: 29 ug/dL (ref 28–170)
Saturation Ratios: 12 % (ref 10.4–31.8)
TIBC: 249 ug/dL — AB (ref 250–450)
UIBC: 220 ug/dL

## 2014-09-28 LAB — CBC WITH DIFFERENTIAL/PLATELET
BASOS ABS: 0 10*3/uL (ref 0–0.1)
BASOS PCT: 1 %
EOS PCT: 1 %
Eosinophils Absolute: 0.1 10*3/uL (ref 0–0.7)
HEMATOCRIT: 28.8 % — AB (ref 35.0–47.0)
HEMOGLOBIN: 9.2 g/dL — AB (ref 12.0–16.0)
Lymphocytes Relative: 9 %
Lymphs Abs: 0.5 10*3/uL — ABNORMAL LOW (ref 1.0–3.6)
MCH: 28.3 pg (ref 26.0–34.0)
MCHC: 31.9 g/dL — AB (ref 32.0–36.0)
MCV: 88.6 fL (ref 80.0–100.0)
MONOS PCT: 8 %
Monocytes Absolute: 0.4 10*3/uL (ref 0.2–0.9)
Neutro Abs: 4.2 10*3/uL (ref 1.4–6.5)
Neutrophils Relative %: 81 %
Platelets: 308 10*3/uL (ref 150–440)
RBC: 3.25 MIL/uL — ABNORMAL LOW (ref 3.80–5.20)
RDW: 15.2 % — ABNORMAL HIGH (ref 11.5–14.5)
WBC: 5.2 10*3/uL (ref 3.6–11.0)

## 2014-09-28 LAB — FERRITIN: FERRITIN: 57 ng/mL (ref 11–307)

## 2014-09-30 ENCOUNTER — Inpatient Hospital Stay: Payer: Medicare Other

## 2014-09-30 ENCOUNTER — Inpatient Hospital Stay (HOSPITAL_BASED_OUTPATIENT_CLINIC_OR_DEPARTMENT_OTHER): Payer: Medicare Other | Admitting: Internal Medicine

## 2014-09-30 VITALS — BP 162/83 | HR 72 | Temp 97.3°F | Resp 18 | Ht 67.0 in | Wt 131.2 lb

## 2014-09-30 DIAGNOSIS — D472 Monoclonal gammopathy: Secondary | ICD-10-CM

## 2014-09-30 DIAGNOSIS — M069 Rheumatoid arthritis, unspecified: Secondary | ICD-10-CM

## 2014-09-30 DIAGNOSIS — Z8673 Personal history of transient ischemic attack (TIA), and cerebral infarction without residual deficits: Secondary | ICD-10-CM

## 2014-09-30 DIAGNOSIS — D509 Iron deficiency anemia, unspecified: Secondary | ICD-10-CM

## 2014-09-30 DIAGNOSIS — D72819 Decreased white blood cell count, unspecified: Secondary | ICD-10-CM

## 2014-09-30 DIAGNOSIS — Z79899 Other long term (current) drug therapy: Secondary | ICD-10-CM

## 2014-10-10 NOTE — Progress Notes (Signed)
[facility]  [phone] [phone]) [phone]     ID: Malcolm Metro OB: Mar 29, 1931  MR#: [id number]  TIW#:[id number]  Patient Care Team: Emmaline Kluver., MD as PCP - General (Rheumatology)  CHIEF COMPLAINT/DIAGNOSIS:  1. Anemia - secondary to anemia of chronic disease alongwith iron deficiency and on parenteral iron therapy in the past (received Venofer 500 mg in Aug 2013, 400 mg Oct 2013, 100 mg in Jan 2014). Got repeat Venofer 500 mg x 2 doses in April 2016. Has rheumatoid arthritis and follows with Dr.Kernodle. (prior workup showed low TIBC, low haptoglobin, negative Coombs test, LDH 249. ? Coombs negative mild hemolytic anemia versus other etiology like chronic liver disease given low albumin of 3.0 in August 2013). 2. Monoclonal gammopathy of unknown significance 0.5 g/dL, found on workup March 2013. 3. Mild Leukopenia, normal ANC - asymptomatic.  Workup done 06/12/11 -  Hemoglobin 10.9, platelets 251, WBC 3500, ANC 2400, reticulocyte 0.089, ferritin 36, iron 322, saturation 93%. ESR 46, LDH 249, EPO 24.1, haptoglobin <10, SIEP shows M-spike of 0.5 g/dL. Direct Coombs test, ANA, HBsAg, HCV Ab, Coombs test, random-UIEP all unremarkable. Peripheral blood flow study shows increased gamma delta T cells of unclear significance. Ultrasound shows no splenomegaly, small left pleural effusion.    HISTORY OF PRESENT ILLNESS:  Patient returns for continued hematology followup for anemia and monoclonal gammopathy, she was seen few months ago.  States that she is doing about the same, has chronic fatigue and dyspnea on exertion which is unchanged. Denies any major chest pain, orthopnea or PND.  No fevers or recurrent infections.  Appetite is steady.  Denies any bleeding symptoms. No new bone pains.  Denies new paresthesias in extremities. No falls or LOC.   REVIEW OF SYSTEMS:   ROS As in HPI above. In addition, no fever, chills or sweats. No new headaches or  focal weakness.  No new mood disturbances. No  sore throat, cough, shortness of breath, sputum, hemoptysis or chest pain. No dizziness or palpitation. No abdominal pain, constipation, diarrhea, dysuria or hematuria. No new skin rash or bleeding symptoms. No new paresthesias in extremities.    PAST MEDICAL HISTORY: Reviewed.         Atrial fibrillation on anticoagulation  Rheumatic fever with mitral valve replacement 1992  CVA   Anemia  Hypertension  Hematuria secondary to urethral stenosis  Breast biopsy, denies cancer  Tonsillectomy  St. Jude Mitral valve replacement 1992  PAST SURGICAL HISTORY: Reviewed. As above  FAMILY HISTORY: Reviewed. Mother had head and neck cancer.  Denies hematological disorders.  SOCIAL HISTORY: Reviewed. Nonsmoker, denies alcohol usage.  Fairly active and ambulatory  Allergies  Allergen Reactions  . Penicillins Shortness Of Breath  . Doxycycline Rash    Current Outpatient Prescriptions  Medication Sig Dispense Refill  . acetaminophen (TYLENOL) 500 MG tablet Take 500 mg by mouth every 6 (six) hours as needed.    Marland Kitchen amLODipine (NORVASC) 5 MG tablet Take 5 mg by mouth daily.    . Calcium Carb-Cholecalciferol (CALCIUM 600 + D) 600-200 MG-UNIT TABS Take 1 tablet by mouth 2 (two) times daily.    . ferrous sulfate 325 (65 FE) MG tablet Take 325 mg by mouth 2 (two) times daily with a meal.    . folic acid (FOLVITE) 1 MG tablet Take 1 mg by mouth daily.    . furosemide (LASIX) 20 MG tablet Take 20 mg by mouth.    . metoprolol (LOPRESSOR) 50 MG tablet Take 50  mg by mouth 2 (two) times daily.    . pantoprazole (PROTONIX) 40 MG tablet Take 40 mg by mouth 2 (two) times daily.    . vitamin B-12 (CYANOCOBALAMIN) 1000 MCG tablet Take 1,000 mcg by mouth daily.    Marland Kitchen warfarin (COUMADIN) 2 MG tablet Take 2 mg by mouth daily. Saturday-Sunday    . warfarin (COUMADIN) 3 MG tablet Take 3 mg by mouth daily. Monday-Friday     No current facility-administered medications  for this visit.    PHYSICAL EXAM: Filed Vitals:   09/30/14 0919  BP: 162/83  Pulse: 72  Temp: 97.3 F (36.3 C)  Resp: 18     Body mass index is 20.54 kg/(m^2).       GENERAL: Patient is alert and oriented and in no acute distress. There is no icterus. HEENT: EOMs intact. Oral exam negative for thrush or lesions. No cervical lymphadenopathy. CVS: S1S2, regular LUNGS: Bilaterally clear to auscultation, no rhonchi. ABDOMEN: Soft, nontender. No hepatosplenomegaly clinically.  EXTREMITIES: No pedal edema.   LAB RESULTS: Fe 29, sat 12%, ferritin 57, TIBC low at 249.    Component Value Date/Time   NA 141 10/05/2012 0342   K 3.8 10/05/2012 0342   CL 107 10/05/2012 0342   CO2 34* 10/05/2012 0342   GLUCOSE 85 10/05/2012 0342   BUN 14 10/05/2012 0342   CREATININE 0.72 10/05/2012 0342   CALCIUM 8.4* 10/05/2012 0342   PROT 6.8 10/04/2012 0802   ALBUMIN 3.0* 10/04/2012 0802   AST 20 10/04/2012 0802   ALT 15 10/04/2012 0802   ALKPHOS 101 10/04/2012 0802   GFRNONAA >60 10/05/2012 0342   GFRAA >60 10/05/2012 0342   Lab Results  Component Value Date   WBC 5.2 09/28/2014   NEUTROABS 4.2 09/28/2014   HGB 9.2* 09/28/2014   HCT 28.8* 09/28/2014   MCV 88.6 09/28/2014   PLT 308 09/28/2014     STUDIES: 05/31/11 - WBC 3000 with ANC 1900, ALC low at 700, AMC 400, hemoglobin 9.9, MCV 89.5, platelets 280, INR 2.5. March 2013 - M-spike of 0.5 g/dL. 05/01/11 - WBC 3700 with ANC 2700, hemoglobin 9.9, platelet 267, LFT normal except albumin 3.4, creatinine 0.6. July 2012 - WBC 4100, hemoglobin 11.5, platelets 186, creatinine 0.7   ASSESSMENT / PLAN:   1. Anemia secondary to anemia of chronic disease alongwith iron deficiency - reviewed labs from today and d/w patient. Chronic fatigue and dyspnea on exertion is unchanged. Hemoglobin continues to remain low but in a steady range, maintains at 9-10 g/dL. Iron studies today shows improvement into low normal range. Lower serum iron and iron sat  also could be from anemia of chronic disease since TIBC is also low. Plan is to increase oral iron to 3 times daily with meals and monitor. She is agreeable to pursue this plan. Will monitior CBC and iron study q 16 weeks, and  see her back at 48 weeks to make further treatment planning.   2. Monoclonal gammopathy of unknown significance 0.5 g/dL, found on workup March 2013 - no new bone pains, chronic anemia is unchanged. Repeat SIEP Oct 2015 showed stable small M-spike of 0.5 g/dL, IgG of 1614. No intervention needed at this time. Will continue monitoring, get repeat SIEP and serum kappa/lambda quant ratio annually. 3. Mild Leukopenia, normal ANC - asymptomatic. WBC and ANC is in the normal range, no fevers or infections. No intervention needed at this time. Continue to monitor. 4. In between visits, she was advised to call in  case of any fevers, night sweats, unintentional weight loss, progressive anemia symptoms or other new symptoms and will be evaluated sooner.  She is agreeable to this plan.    Leia Alf, MD   10/10/2014 10:41 AM

## 2014-11-08 ENCOUNTER — Other Ambulatory Visit: Payer: Self-pay | Admitting: Neurology

## 2014-11-08 DIAGNOSIS — G20 Parkinson's disease: Secondary | ICD-10-CM

## 2014-11-10 ENCOUNTER — Ambulatory Visit
Admission: RE | Admit: 2014-11-10 | Discharge: 2014-11-10 | Disposition: A | Discharge status: Home / Self Care | Payer: Medicare Other | Source: Ambulatory Visit | Attending: Neurology | Admitting: Neurology

## 2014-11-10 DIAGNOSIS — G20 Parkinson's disease: Secondary | ICD-10-CM | POA: Diagnosis present

## 2014-12-08 ENCOUNTER — Emergency Department: Payer: Medicare Other

## 2014-12-08 ENCOUNTER — Encounter: Payer: Self-pay | Admitting: *Deleted

## 2014-12-08 ENCOUNTER — Emergency Department
Admission: EM | Admit: 2014-12-08 | Discharge: 2014-12-08 | Disposition: A | Discharge status: Home / Self Care | Payer: Medicare Other | Attending: Emergency Medicine | Admitting: Emergency Medicine

## 2014-12-08 DIAGNOSIS — N390 Urinary tract infection, site not specified: Secondary | ICD-10-CM | POA: Diagnosis not present

## 2014-12-08 DIAGNOSIS — Z880 Allergy status to penicillin: Secondary | ICD-10-CM | POA: Diagnosis not present

## 2014-12-08 DIAGNOSIS — G8929 Other chronic pain: Secondary | ICD-10-CM | POA: Diagnosis not present

## 2014-12-08 DIAGNOSIS — Z79899 Other long term (current) drug therapy: Secondary | ICD-10-CM | POA: Insufficient documentation

## 2014-12-08 DIAGNOSIS — R2242 Localized swelling, mass and lump, left lower limb: Secondary | ICD-10-CM | POA: Diagnosis present

## 2014-12-08 DIAGNOSIS — I482 Chronic atrial fibrillation: Secondary | ICD-10-CM | POA: Insufficient documentation

## 2014-12-08 DIAGNOSIS — D649 Anemia, unspecified: Secondary | ICD-10-CM | POA: Insufficient documentation

## 2014-12-08 DIAGNOSIS — Z7901 Long term (current) use of anticoagulants: Secondary | ICD-10-CM | POA: Diagnosis not present

## 2014-12-08 DIAGNOSIS — I10 Essential (primary) hypertension: Secondary | ICD-10-CM | POA: Diagnosis not present

## 2014-12-08 DIAGNOSIS — R609 Edema, unspecified: Secondary | ICD-10-CM | POA: Diagnosis not present

## 2014-12-08 HISTORY — DX: Essential (primary) hypertension: I10

## 2014-12-08 HISTORY — DX: Unspecified osteoarthritis, unspecified site: M19.90

## 2014-12-08 HISTORY — DX: Heart failure, unspecified: I50.9

## 2014-12-08 HISTORY — DX: Parkinson's disease: G20

## 2014-12-08 LAB — URINALYSIS COMPLETE WITH MICROSCOPIC (ARMC ONLY)
Bacteria, UA: NONE SEEN
Bilirubin Urine: NEGATIVE
GLUCOSE, UA: NEGATIVE mg/dL
Ketones, ur: NEGATIVE mg/dL
Nitrite: NEGATIVE
Protein, ur: NEGATIVE mg/dL
Specific Gravity, Urine: 1.01 (ref 1.005–1.030)
pH: 6 (ref 5.0–8.0)

## 2014-12-08 LAB — COMPREHENSIVE METABOLIC PANEL
ALT: <5 U/L — ABNORMAL LOW (ref 14–54)
AST: 19 U/L (ref 15–41)
Albumin: 1.3 g/dL — ABNORMAL LOW (ref 3.5–5.0)
Alkaline Phosphatase: 116 U/L (ref 38–126)
Anion gap: 6 (ref 5–15)
BUN: 19 mg/dL (ref 6–20)
CO2: 28 mmol/L (ref 22–32)
Calcium: 6.9 mg/dL — ABNORMAL LOW (ref 8.9–10.3)
Chloride: 97 mmol/L — ABNORMAL LOW (ref 101–111)
Creatinine, Ser: 0.64 mg/dL (ref 0.44–1.00)
GFR calc Af Amer: >60 mL/min (ref >60)
GFR calc non Af Amer: >60 mL/min (ref >60)
Glucose, Bld: 100 mg/dL — ABNORMAL HIGH (ref 65–99)
POTASSIUM: 3.4 mmol/L — AB (ref 3.5–5.1)
Sodium: 131 mmol/L — ABNORMAL LOW (ref 135–145)
TOTAL PROTEIN: 5.3 g/dL — AB (ref 6.5–8.1)
Total Bilirubin: 0.7 mg/dL (ref 0.3–1.2)

## 2014-12-08 LAB — CBC WITH DIFFERENTIAL/PLATELET
BASOS ABS: 0 10*3/uL (ref 0–0.1)
Basophils Relative: 1 %
Eosinophils Absolute: 0 10*3/uL (ref 0–0.7)
Eosinophils Relative: 0 %
HCT: 25.8 % — ABNORMAL LOW (ref 35.0–47.0)
Hemoglobin: 8.4 g/dL — ABNORMAL LOW (ref 12.0–16.0)
LYMPHS PCT: 7 %
Lymphs Abs: 0.5 10*3/uL — ABNORMAL LOW (ref 1.0–3.6)
MCH: 27.3 pg (ref 26.0–34.0)
MCHC: 32.4 g/dL (ref 32.0–36.0)
MCV: 84.3 fL (ref 80.0–100.0)
MONO ABS: 0.5 10*3/uL (ref 0.2–0.9)
MONOS PCT: 7 %
NEUTROS ABS: 5.9 10*3/uL (ref 1.4–6.5)
Neutrophils Relative %: 85 %
Platelets: 302 10*3/uL (ref 150–440)
RBC: 3.06 MIL/uL — ABNORMAL LOW (ref 3.80–5.20)
RDW: 19.6 % — AB (ref 11.5–14.5)
WBC: 6.9 10*3/uL (ref 3.6–11.0)

## 2014-12-08 LAB — PROTIME-INR
INR: 4.77
Prothrombin Time: 44.6 seconds — ABNORMAL HIGH (ref 11.4–15.0)

## 2014-12-08 LAB — BRAIN NATRIURETIC PEPTIDE: B NATRIURETIC PEPTIDE 5: 290 pg/mL — AB (ref 0.0–100.0)

## 2014-12-08 LAB — APTT: aPTT: 73 seconds — ABNORMAL HIGH (ref 24–36)

## 2014-12-08 LAB — TSH: TSH: 5.466 u[IU]/mL — AB (ref 0.350–4.500)

## 2014-12-08 LAB — TROPONIN I

## 2014-12-08 MED ORDER — FUROSEMIDE 20 MG PO TABS: 20.0000 mg | ORAL_TABLET | Freq: Every day | ORAL | Status: DC

## 2014-12-08 MED ORDER — NITROFURANTOIN MACROCRYSTAL 100 MG PO CAPS: 100.0000 mg | ORAL_CAPSULE | Freq: Two times a day (BID) | ORAL | Status: DC

## 2014-12-08 MED ORDER — POTASSIUM CHLORIDE CRYS ER 20 MEQ PO TBCR
20.0000 meq | EXTENDED_RELEASE_TABLET | Freq: Once | ORAL | Status: AC
  Administered 2014-12-08: 20 meq via ORAL
  Filled 2014-12-08: qty 1

## 2014-12-08 MED ORDER — FUROSEMIDE 10 MG/ML IJ SOLN
40.0000 mg | Freq: Once | INTRAMUSCULAR | Status: AC
  Administered 2014-12-08: 40 mg via INTRAVENOUS
  Filled 2014-12-08: qty 4

## 2014-12-08 NOTE — ED Provider Notes (Signed)
Time Seen: Approximately ----------------------------------------- 4:25 PM on 12/08/2014 -----------------------------------------    I have reviewed the triage notes  Chief Complaint: Leg Swelling and Arm Swelling   History of Present Illness: Gwendolyn Norman is a 81 y.o. female who presents with gradual onset of extensive extremity edema. Patient has some chronic peripheral edema mostly in her legs and is on low dose Lasix 20 mg every other day. Patient was transported here by EMS due to the increasing edema. She denies any shortness of breath, fever. She has some chronic posterior leg pain by description of her daughter. Patient states that she has had some increased recent urination. She denies any burning with urination or fever. Patient denies any abdominal pain. She's also noticed some of the swelling in both of her upper extremities right side slightly worse than the left. She is on chronic anticoagulation therapy for [employer]'s mitral valve.   Past Medical History  Diagnosis Date  . CHF (congestive heart failure)   . Parkinson disease   . Hypertension   . Osteoarthritis    iron deficiency anemia  There are no active problems to display for this patient.   Past Surgical History  Procedure Laterality Date  . Cardiac valve replacement      Past Surgical History  Procedure Laterality Date  . Cardiac valve replacement      Current Outpatient Rx  Name  Route  Sig  Dispense  Refill  . acetaminophen (TYLENOL) 500 MG tablet   Oral   Take 500 mg by mouth every 6 (six) hours as needed.         Marland Kitchen amLODipine (NORVASC) 5 MG tablet   Oral   Take 5 mg by mouth daily.         . Calcium Carb-Cholecalciferol (CALCIUM 600 + D) 600-200 MG-UNIT TABS   Oral   Take 1 tablet by mouth 2 (two) times daily.         . ferrous sulfate 325 (65 FE) MG tablet   Oral   Take 325 mg by mouth 2 (two) times daily with a meal.         . folic acid (FOLVITE) 1 MG tablet   Oral  Take 1 mg by mouth daily.         . furosemide (LASIX) 20 MG tablet   Oral   Take 20 mg by mouth.         . metoprolol (LOPRESSOR) 50 MG tablet   Oral   Take 50 mg by mouth 2 (two) times daily.         . pantoprazole (PROTONIX) 40 MG tablet   Oral   Take 40 mg by mouth 2 (two) times daily.         . vitamin B-12 (CYANOCOBALAMIN) 1000 MCG tablet   Oral   Take 1,000 mcg by mouth daily.         Marland Kitchen warfarin (COUMADIN) 2 MG tablet   Oral   Take 2 mg by mouth daily. Saturday-Sunday         . warfarin (COUMADIN) 3 MG tablet   Oral   Take 3 mg by mouth daily. Monday-Friday           Allergies:  Penicillins and Doxycycline  Family History: History reviewed. No pertinent family history.  Social History: Social History  Substance Use Topics  . Smoking status: Never Smoker   . Smokeless tobacco: None  . Alcohol Use: No     Review of  Systems:   10 point review of systems was performed and was otherwise negative:  Constitutional: No fever Eyes: No visual disturbances ENT: No sore throat, ear pain Cardiac: No chest pain Respiratory: No shortness of breath, wheezing, or stridor Abdomen: No abdominal pain, no vomiting, No diarrhea Endocrine: No weight loss, No night sweats Extremities: Outside of the edema no other mentions of rash etc., cyanosis Skin: No rashes, easy bruising Neurologic: No focal weakness, trouble with speech or swollowing Urologic: Mild burning with urination, no Hematuria,   Physical Exam:  ED Triage Vitals  Enc Vitals Group     BP 12/08/14 1423 110/61 mmHg     Pulse Rate 12/08/14 1423 74     Resp 12/08/14 1423 16     Temp 12/08/14 1423 97.7 F (36.5 C)     Temp Source 12/08/14 1423 Oral     SpO2 12/08/14 1423 99 %     Weight 12/08/14 1423 155 lb (70.308 kg)     Height 12/08/14 1423 5\' 7"  (1.702 m)     Head Cir --      Peak Flow --      Pain Score --      Pain Loc --      Pain Edu? --      Excl. in [place]? --     General:  Awake , Alert , and Oriented times 3; GCS 15 Head: Normal cephalic , atraumatic Eyes: Pupils equal , round, reactive to light Nose/Throat: No nasal drainage, patent upper airway without erythema or exudate.  Neck: Supple, Full range of motion, No anterior adenopathy or palpable thyroid masses Lungs: Clear to ascultation without wheezes , rhonchi, or rales Heart: Regular rate, regular rhythm with midsystolic mechanical click. No gallops or rubs Abdomen: Soft, non tender without rebound, guarding , or rigidity; bowel sounds positive and symmetric in all 4 quadrants. No organomegaly .        Extremities: 4+ peripheral edema in both lower extremities with pitting up to the level of the knee and somewhat superior region. Edema appears circumferential with no associated extensive erythema. Mild edema noted in both upper extremities nonpitting Neurologic: normal ambulation, Motor symmetric without deficits, sensory intact Skin: warm, dry, no rashes   Labs:   All laboratory work was reviewed including any pertinent negatives or positives listed below:  Labs Reviewed  CBC WITH DIFFERENTIAL/PLATELET - Abnormal; Notable for the following:    RBC 3.06 (*)    Hemoglobin 8.4 (*)    HCT 25.8 (*)    RDW 19.6 (*)    Lymphs Abs 0.5 (*)    All other components within normal limits  COMPREHENSIVE METABOLIC PANEL - Abnormal; Notable for the following:    Sodium 131 (*)    Potassium 3.4 (*)    Chloride 97 (*)    Glucose, Bld 100 (*)    Calcium 6.9 (*)    Total Protein 5.3 (*)    Albumin 1.3 (*)    ALT <5 (*)    All other components within normal limits  PROTIME-INR  APTT  URINALYSIS COMPLETEWITH MICROSCOPIC ([facility] ONLY)  BRAIN NATRIURETIC PEPTIDE  TROPONIN I  TSH   review of the laboratory work shows a elevated TSH which may indicate some hypothyroidism. Patient's INR is 4.77. She apparently has had a recent outpatient tests done last week which was 5.0. Review of the urine shows findings of a mild  urinary tract infection. Her hemoglobin at 8.4 seems to be roughly consistent with previous hemoglobins  in the past and she does have a long history of iron deficiency anemia. She has follow-up with her hematologist later this month.  EKG:  ED ECG REPORT I, Daymon Larsen, the attending physician, personally viewed and interpreted this ECG.  Date: 12/08/2014 EKG Time: 1840 Rate: 64 Rhythm: Atrial fibrillation QRS Axis: normal Intervals: normal ST/T Wave abnormalities: normal Conduction Disutrbances: none Narrative Interpretation: unremarkable    Radiology: EXAM: PORTABLE CHEST - 1 VIEW  COMPARISON: 10/04/2012  FINDINGS: The heart is enlarged but stable. There is tortuosity and calcification of the thoracic aorta. The lungs are grossly clear. No edema or definite pleural effusion. Low lung volumes with vascular crowding and streaky basilar atelectasis. The bony thorax is intact.  IMPRESSION: Stable cardiac enlargement. No acute pulmonary findings.  I personally reviewed the radiologic studies   Procedures:  Patient had a Foley catheter inserted by the nursing staff which was removed upon departure. Patient had approximately 2 L of urine output while here in emergency department.       ED Course:    Patient's stay here was uneventful and she remained hemodynamically stable. She is on a low dose Lasix 20 mg every other day and she was placed on 20 mg of Lasix per day. She has a long history of atrial fibrillation and had some episodes where she had atrial fibrillation with a rapid ventricular rate though it would correct into a normal rate controlled A. fib she states she is used to that this is nothing unusual. As stated above and review of her laboratory work and did not appear to be any significant abnormalities that require to stay in the hospital. She does have a urinary tract infection and has a penicillin allergy. I placed her on Macrodantin with a urine culture  pending. The daughter was given a copy of the laboratory work and it was all reviewed with her at the bedside. Then advised touch base with her primary physician for further outpatient follow-up.   Assessment:  Urinary tract infection Peripheral edema Chronic anemia Chronic atrial fibrillation*      Plan:  Discharged home Patient was advised to return immediately if condition worsens. Patient was advised to follow up with her primary care physician or other specialized physicians involved and in their current assessment.           Daymon Larsen, MD 12/08/14 2000

## 2014-12-08 NOTE — Progress Notes (Signed)
LCSW will meet with the family at [place]

## 2014-12-08 NOTE — Progress Notes (Signed)
LCSW met with patient and daughter. Patient has already a lot of home support in place however as she is getting a bit Older and more medical issues daughter would like to start the process of placing her Mom in SNF. Provided family with a FL2 blank copy to take to her family doctor. Daughter was provided several Medical sales representative and some hand outs on additional home health aid information. Patient is [facility] and has already applied for medicaid and looked at [facility].   Patient is to be transported and return home with her daughter once medically cleared

## 2014-12-08 NOTE — ED Notes (Signed)
Pt arrived via EMS from home after having edema for the past three weeks. Pt has hx of CHF and takes lasix every other day. Pt reports attempting to get in touch with [facility] PCP but was unable to do so and decided it was time to be seen. No SOB reported. Pt reports being able to urinate but reports increase in urination. Pt smells of urine upon arrival.

## 2014-12-08 NOTE — ED Notes (Signed)
Pt was transferred to wheelchair with two RNs. Pt taken to car and 2 RNs assisted to transfer pt into truck. No distress noted. Pt is having difficulty with movement but family reports having help waiting at home.

## 2014-12-08 NOTE — Discharge Instructions (Signed)
Peripheral Edema You have swelling in your legs (peripheral edema). This swelling is due to excess accumulation of salt and water in your body. Edema may be a sign of heart, kidney or liver disease, or a side effect of a medication. It may also be due to problems in the leg veins. Elevating your legs and using special support stockings may be very helpful, if the cause of the swelling is due to poor venous circulation. Avoid long periods of standing, whatever the cause. Treatment of edema depends on identifying the cause. Chips, pretzels, pickles and other salty foods should be avoided. Restricting salt in your diet is almost always needed. Water pills (diuretics) are often used to remove the excess salt and water from your body via urine. These medicines prevent the kidney from reabsorbing sodium. This increases urine flow. Diuretic treatment may also result in lowering of potassium levels in your body. Potassium supplements may be needed if you have to use diuretics daily. Daily weights can help you keep track of your progress in clearing your edema. You should call your caregiver for follow up care as recommended. SEEK IMMEDIATE MEDICAL CARE IF:   You have increased swelling, pain, redness, or heat in your legs.  You develop shortness of breath, especially when lying down.  You develop chest or abdominal pain, weakness, or fainting.  You have a fever. Document Released: 04/19/2004 Document Revised: 06/04/2011 Document Reviewed: 03/30/2009 [facility] Patient Information 2015 [place], [place]. This information is not intended to replace advice given to you by your health care provider. Make sure you discuss any questions you have with your health care provider.  Urinary Tract Infection Urinary tract infections (UTIs) can develop anywhere along your urinary tract. Your urinary tract is your body's drainage system for removing wastes and extra water. Your urinary tract includes two kidneys, two ureters,  a bladder, and a urethra. Your kidneys are a pair of bean-shaped organs. Each kidney is about the size of your fist. They are located below your ribs, one on each side of your spine. CAUSES Infections are caused by microbes, which are microscopic organisms, including fungi, viruses, and bacteria. These organisms are so small that they can only be seen through a microscope. Bacteria are the microbes that most commonly cause UTIs. SYMPTOMS  Symptoms of UTIs may vary by age and gender of the patient and by the location of the infection. Symptoms in young women typically include a frequent and intense urge to urinate and a painful, burning feeling in the bladder or urethra during urination. Older women and men are more likely to be tired, shaky, and weak and have muscle aches and abdominal pain. A fever may mean the infection is in your kidneys. Other symptoms of a kidney infection include pain in your back or sides below the ribs, nausea, and vomiting. DIAGNOSIS To diagnose a UTI, your caregiver will ask you about your symptoms. Your caregiver also will ask to provide a urine sample. The urine sample will be tested for bacteria and white blood cells. White blood cells are made by your body to help fight infection. TREATMENT  Typically, UTIs can be treated with medication. Because most UTIs are caused by a bacterial infection, they usually can be treated with the use of antibiotics. The choice of antibiotic and length of treatment depend on your symptoms and the type of bacteria causing your infection. HOME CARE INSTRUCTIONS  If you were prescribed antibiotics, take them exactly as your caregiver instructs you. Finish the medication  even if you feel better after you have only taken some of the medication.  Drink enough water and fluids to keep your urine clear or pale yellow.  Avoid caffeine, tea, and carbonated beverages. They tend to irritate your bladder.  Empty your bladder often. Avoid holding  urine for long periods of time.  Empty your bladder before and after sexual intercourse.  After a bowel movement, women should cleanse from front to back. Use each tissue only once. SEEK MEDICAL CARE IF:   You have back pain.  You develop a fever.  Your symptoms do not begin to resolve within 3 days. SEEK IMMEDIATE MEDICAL CARE IF:   You have severe back pain or lower abdominal pain.  You develop chills.  You have nausea or vomiting.  You have continued burning or discomfort with urination. MAKE SURE YOU:   Understand these instructions.  Will watch your condition.  Will get help right away if you are not doing well or get worse. Document Released: 12/20/2004 Document Revised: 09/11/2011 Document Reviewed: 04/20/2011 [facility] Patient Information 2015 [place], [place]. This information is not intended to replace advice given to you by your health care provider. Make sure you discuss any questions you have with your health care provider.   Please return immediately if condition worsens. Please contact her primary physician or the physician you were given for referral. If you have any specialist physicians involved in her treatment and plan please also contact them. Thank you for using [facility]. Please follow-up with your primary physician for further adjustments of your Coumadin/warfarin dosage as needed. Furosemide is now daily basis as opposed to every other day.

## 2014-12-08 NOTE — Care Management (Signed)
Patient presents from home with edema.  Patient lives at home with daughter.  Patient's daughter states that the patient has a walker, wheelchair, and BSC at the home.  Patient PCP is at [facility].  Patient has home health RN, and PT established with [facility].  Daughter expresses concern of being able to provide the care that she needs for her mother.  She asks about obtaining 24 hour care.  I provided the patients daughter will a list of personal care services, and explained that this was not a service that would be covered by insurance.  Patient's daughter has initiated the medicaid application process in order to pursue assisted living placement. Daughter states that there may be a delay in approval how ever she is going to follow up.  I have contacted social work to see if they could provide any additional input.

## 2014-12-14 ENCOUNTER — Encounter: Payer: Self-pay | Admitting: Emergency Medicine

## 2014-12-14 ENCOUNTER — Inpatient Hospital Stay: Payer: Medicare Other

## 2014-12-14 ENCOUNTER — Inpatient Hospital Stay
Admission: EM | Admit: 2014-12-14 | Discharge: 2014-12-22 | DRG: 374 | Disposition: A | Discharge status: Skilled Nursing Facility | Payer: Medicare Other | Attending: Internal Medicine | Admitting: Internal Medicine

## 2014-12-14 DIAGNOSIS — R791 Abnormal coagulation profile: Secondary | ICD-10-CM | POA: Diagnosis present

## 2014-12-14 DIAGNOSIS — Z66 Do not resuscitate: Secondary | ICD-10-CM | POA: Diagnosis not present

## 2014-12-14 DIAGNOSIS — Z6821 Body mass index (BMI) 21.0-21.9, adult: Secondary | ICD-10-CM | POA: Diagnosis not present

## 2014-12-14 DIAGNOSIS — K921 Melena: Secondary | ICD-10-CM | POA: Diagnosis present

## 2014-12-14 DIAGNOSIS — Z87440 Personal history of urinary (tract) infections: Secondary | ICD-10-CM | POA: Diagnosis not present

## 2014-12-14 DIAGNOSIS — I5032 Chronic diastolic (congestive) heart failure: Secondary | ICD-10-CM | POA: Diagnosis not present

## 2014-12-14 DIAGNOSIS — I248 Other forms of acute ischemic heart disease: Secondary | ICD-10-CM | POA: Diagnosis present

## 2014-12-14 DIAGNOSIS — E43 Unspecified severe protein-calorie malnutrition: Secondary | ICD-10-CM | POA: Diagnosis not present

## 2014-12-14 DIAGNOSIS — T45515A Adverse effect of anticoagulants, initial encounter: Secondary | ICD-10-CM | POA: Diagnosis not present

## 2014-12-14 DIAGNOSIS — I272 Other secondary pulmonary hypertension: Secondary | ICD-10-CM | POA: Diagnosis present

## 2014-12-14 DIAGNOSIS — Z8249 Family history of ischemic heart disease and other diseases of the circulatory system: Secondary | ICD-10-CM | POA: Diagnosis not present

## 2014-12-14 DIAGNOSIS — IMO0002 Reserved for concepts with insufficient information to code with codable children: Secondary | ICD-10-CM

## 2014-12-14 DIAGNOSIS — D630 Anemia in neoplastic disease: Secondary | ICD-10-CM | POA: Diagnosis not present

## 2014-12-14 DIAGNOSIS — Z79899 Other long term (current) drug therapy: Secondary | ICD-10-CM

## 2014-12-14 DIAGNOSIS — D62 Acute posthemorrhagic anemia: Secondary | ICD-10-CM | POA: Diagnosis present

## 2014-12-14 DIAGNOSIS — I10 Essential (primary) hypertension: Secondary | ICD-10-CM | POA: Diagnosis not present

## 2014-12-14 DIAGNOSIS — C189 Malignant neoplasm of colon, unspecified: Principal | ICD-10-CM | POA: Diagnosis present

## 2014-12-14 DIAGNOSIS — C183 Malignant neoplasm of hepatic flexure: Secondary | ICD-10-CM | POA: Diagnosis not present

## 2014-12-14 DIAGNOSIS — R609 Edema, unspecified: Secondary | ICD-10-CM | POA: Diagnosis present

## 2014-12-14 DIAGNOSIS — I099 Rheumatic heart disease, unspecified: Secondary | ICD-10-CM | POA: Diagnosis not present

## 2014-12-14 DIAGNOSIS — R948 Abnormal results of function studies of other organs and systems: Secondary | ICD-10-CM | POA: Diagnosis not present

## 2014-12-14 DIAGNOSIS — I059 Rheumatic mitral valve disease, unspecified: Secondary | ICD-10-CM | POA: Diagnosis not present

## 2014-12-14 DIAGNOSIS — L8990 Pressure ulcer of unspecified site, unspecified stage: Secondary | ICD-10-CM | POA: Diagnosis present

## 2014-12-14 DIAGNOSIS — M1990 Unspecified osteoarthritis, unspecified site: Secondary | ICD-10-CM | POA: Diagnosis present

## 2014-12-14 DIAGNOSIS — Z809 Family history of malignant neoplasm, unspecified: Secondary | ICD-10-CM | POA: Diagnosis not present

## 2014-12-14 DIAGNOSIS — I482 Chronic atrial fibrillation: Secondary | ICD-10-CM | POA: Diagnosis present

## 2014-12-14 DIAGNOSIS — I4891 Unspecified atrial fibrillation: Secondary | ICD-10-CM | POA: Diagnosis not present

## 2014-12-14 DIAGNOSIS — R1900 Intra-abdominal and pelvic swelling, mass and lump, unspecified site: Secondary | ICD-10-CM | POA: Diagnosis not present

## 2014-12-14 DIAGNOSIS — Z888 Allergy status to other drugs, medicaments and biological substances status: Secondary | ICD-10-CM

## 2014-12-14 DIAGNOSIS — Z515 Encounter for palliative care: Secondary | ICD-10-CM

## 2014-12-14 DIAGNOSIS — G20 Parkinson's disease: Secondary | ICD-10-CM | POA: Diagnosis present

## 2014-12-14 DIAGNOSIS — Z952 Presence of prosthetic heart valve: Secondary | ICD-10-CM

## 2014-12-14 DIAGNOSIS — Z880 Allergy status to penicillin: Secondary | ICD-10-CM

## 2014-12-14 DIAGNOSIS — D509 Iron deficiency anemia, unspecified: Secondary | ICD-10-CM

## 2014-12-14 DIAGNOSIS — D500 Iron deficiency anemia secondary to blood loss (chronic): Secondary | ICD-10-CM | POA: Diagnosis present

## 2014-12-14 DIAGNOSIS — R229 Localized swelling, mass and lump, unspecified: Secondary | ICD-10-CM

## 2014-12-14 DIAGNOSIS — E876 Hypokalemia: Secondary | ICD-10-CM | POA: Diagnosis present

## 2014-12-14 DIAGNOSIS — Z8673 Personal history of transient ischemic attack (TIA), and cerebral infarction without residual deficits: Secondary | ICD-10-CM | POA: Diagnosis not present

## 2014-12-14 DIAGNOSIS — N390 Urinary tract infection, site not specified: Secondary | ICD-10-CM | POA: Diagnosis present

## 2014-12-14 DIAGNOSIS — Z7901 Long term (current) use of anticoagulants: Secondary | ICD-10-CM | POA: Diagnosis not present

## 2014-12-14 DIAGNOSIS — K210 Gastro-esophageal reflux disease with esophagitis: Secondary | ICD-10-CM | POA: Diagnosis present

## 2014-12-14 DIAGNOSIS — D472 Monoclonal gammopathy: Secondary | ICD-10-CM

## 2014-12-14 DIAGNOSIS — D508 Other iron deficiency anemias: Secondary | ICD-10-CM | POA: Diagnosis not present

## 2014-12-14 DIAGNOSIS — D649 Anemia, unspecified: Secondary | ICD-10-CM | POA: Diagnosis not present

## 2014-12-14 HISTORY — DX: Presence of prosthetic heart valve: Z95.2

## 2014-12-14 HISTORY — DX: Pulmonary hypertension, unspecified: I27.20

## 2014-12-14 HISTORY — DX: Unspecified atrial fibrillation: I48.91

## 2014-12-14 HISTORY — DX: Gastro-esophageal reflux disease without esophagitis: K21.9

## 2014-12-14 HISTORY — DX: Cerebral infarction, unspecified: I63.9

## 2014-12-14 HISTORY — DX: Rheumatic heart disease, unspecified: I09.9

## 2014-12-14 LAB — COMPREHENSIVE METABOLIC PANEL
ALBUMIN: 1.2 g/dL — AB (ref 3.5–5.0)
ALK PHOS: 105 U/L (ref 38–126)
ANION GAP: 8 (ref 5–15)
AST: 18 U/L (ref 15–41)
BILIRUBIN TOTAL: 0.6 mg/dL (ref 0.3–1.2)
BUN: 18 mg/dL (ref 6–20)
CALCIUM: 6.8 mg/dL — AB (ref 8.9–10.3)
CO2: 29 mmol/L (ref 22–32)
CREATININE: 0.59 mg/dL (ref 0.44–1.00)
Chloride: 93 mmol/L — ABNORMAL LOW (ref 101–111)
GFR calc Af Amer: >60 mL/min (ref >60)
GFR calc non Af Amer: >60 mL/min (ref >60)
GLUCOSE: 97 mg/dL (ref 65–99)
Potassium: 3.1 mmol/L — ABNORMAL LOW (ref 3.5–5.1)
Sodium: 130 mmol/L — ABNORMAL LOW (ref 135–145)
TOTAL PROTEIN: 5.1 g/dL — AB (ref 6.5–8.1)

## 2014-12-14 LAB — URINALYSIS COMPLETE WITH MICROSCOPIC (ARMC ONLY)
BILIRUBIN URINE: NEGATIVE
GLUCOSE, UA: NEGATIVE mg/dL
KETONES UR: NEGATIVE mg/dL
NITRITE: NEGATIVE
PH: 7 (ref 5.0–8.0)
Protein, ur: NEGATIVE mg/dL
SPECIFIC GRAVITY, URINE: 1.006 (ref 1.005–1.030)

## 2014-12-14 LAB — TYPE AND SCREEN
ABO/RH(D): O POS
ANTIBODY SCREEN: NEGATIVE

## 2014-12-14 LAB — RETICULOCYTES
RBC.: 2.69 MIL/uL — AB (ref 3.80–5.20)
RETIC CT PCT: 1.3 % (ref 0.4–3.1)
Retic Count, Absolute: 35 10*3/uL (ref 19.0–183.0)

## 2014-12-14 LAB — CBC
HEMATOCRIT: 23.7 % — AB (ref 35.0–47.0)
HEMOGLOBIN: 7.7 g/dL — AB (ref 12.0–16.0)
MCH: 27.1 pg (ref 26.0–34.0)
MCHC: 32.7 g/dL (ref 32.0–36.0)
MCV: 82.8 fL (ref 80.0–100.0)
Platelets: 233 10*3/uL (ref 150–440)
RBC: 2.86 MIL/uL — AB (ref 3.80–5.20)
RDW: 19.6 % — ABNORMAL HIGH (ref 11.5–14.5)
WBC: 10.3 10*3/uL (ref 3.6–11.0)

## 2014-12-14 LAB — TROPONIN I: Troponin I: 0.05 ng/mL — ABNORMAL HIGH (ref <0.031)

## 2014-12-14 LAB — BRAIN NATRIURETIC PEPTIDE: B Natriuretic Peptide: 207 pg/mL — ABNORMAL HIGH (ref 0.0–100.0)

## 2014-12-14 LAB — HEMOGLOBIN
Hemoglobin: 7.7 g/dL — ABNORMAL LOW (ref 12.0–16.0)
Hemoglobin: 7.2 g/dL — ABNORMAL LOW (ref 12.0–16.0)

## 2014-12-14 LAB — ABO/RH: ABO/RH(D): O POS

## 2014-12-14 LAB — PROTIME-INR
INR: 6.61 — AB (ref 0.00–1.49)
Prothrombin Time: 57.3 seconds — ABNORMAL HIGH (ref 11.6–15.2)

## 2014-12-14 MED ORDER — METOPROLOL TARTRATE 50 MG PO TABS
50.0000 mg | ORAL_TABLET | Freq: Two times a day (BID) | ORAL | Status: DC
Start: 2014-12-14 — End: 2014-12-22
  Administered 2014-12-14 – 2014-12-22 (×15): 50 mg via ORAL
  Filled 2014-12-14 (×15): qty 1

## 2014-12-14 MED ORDER — FOLIC ACID 1 MG PO TABS
1.0000 mg | ORAL_TABLET | Freq: Every day | ORAL | Status: DC
Start: 2014-12-14 — End: 2014-12-22
  Administered 2014-12-15 – 2014-12-22 (×8): 1 mg via ORAL
  Filled 2014-12-14 (×8): qty 1

## 2014-12-14 MED ORDER — FUROSEMIDE 20 MG PO TABS
20.0000 mg | ORAL_TABLET | Freq: Every day | ORAL | Status: DC
  Administered 2014-12-15 – 2014-12-22 (×8): 20 mg via ORAL
  Filled 2014-12-14 (×8): qty 1

## 2014-12-14 MED ORDER — CALCIUM CARBONATE-VITAMIN D 500-200 MG-UNIT PO TABS
ORAL_TABLET | Freq: Two times a day (BID) | ORAL | Status: DC
Start: 2014-12-14 — End: 2014-12-21
  Administered 2014-12-14 – 2014-12-21 (×14): 1 via ORAL
  Filled 2014-12-14 (×15): qty 1

## 2014-12-14 MED ORDER — POTASSIUM CHLORIDE CRYS ER 20 MEQ PO TBCR
40.0000 meq | EXTENDED_RELEASE_TABLET | Freq: Once | ORAL | Status: AC
  Administered 2014-12-14: 40 meq via ORAL
  Filled 2014-12-14: qty 2

## 2014-12-14 MED ORDER — VITAMIN B-12 1000 MCG PO TABS
1000.0000 ug | ORAL_TABLET | Freq: Every day | ORAL | Status: DC
  Administered 2014-12-15 – 2014-12-22 (×8): 1000 ug via ORAL
  Filled 2014-12-14 (×8): qty 1

## 2014-12-14 MED ORDER — VITAMIN K1 10 MG/ML IJ SOLN
10.0000 mg | Freq: Once | INTRAVENOUS | Status: AC
  Administered 2014-12-14: 10 mg via INTRAVENOUS
  Filled 2014-12-14 (×2): qty 1

## 2014-12-14 MED ORDER — AMLODIPINE BESYLATE 5 MG PO TABS
5.0000 mg | ORAL_TABLET | Freq: Every day | ORAL | Status: DC
  Administered 2014-12-15: 5 mg via ORAL
  Filled 2014-12-14: qty 1

## 2014-12-14 MED ORDER — PANTOPRAZOLE SODIUM 40 MG PO TBEC
40.0000 mg | DELAYED_RELEASE_TABLET | Freq: Every day | ORAL | Status: DC
  Administered 2014-12-15 – 2014-12-22 (×8): 40 mg via ORAL
  Filled 2014-12-14 (×8): qty 1

## 2014-12-14 MED ORDER — SODIUM CHLORIDE 0.9 % IJ SOLN
3.0000 mL | Freq: Two times a day (BID) | INTRAMUSCULAR | Status: DC
  Administered 2014-12-14 – 2014-12-22 (×15): 3 mL via INTRAVENOUS

## 2014-12-14 MED ORDER — INFLUENZA VAC SPLIT QUAD 0.5 ML IM SUSY
0.5000 mL | PREFILLED_SYRINGE | INTRAMUSCULAR | Status: AC
  Administered 2014-12-15: 0.5 mL via INTRAMUSCULAR
  Filled 2014-12-14: qty 0.5

## 2014-12-14 MED ORDER — ACETAMINOPHEN 500 MG PO TABS
500.0000 mg | ORAL_TABLET | Freq: Four times a day (QID) | ORAL | Status: DC | PRN
  Filled 2014-12-14: qty 1

## 2014-12-14 MED ORDER — SENNOSIDES-DOCUSATE SODIUM 8.6-50 MG PO TABS: 1.0000 | ORAL_TABLET | Freq: Every evening | ORAL | Status: DC | PRN

## 2014-12-14 MED ORDER — CARBIDOPA-LEVODOPA 25-100 MG PO TABS
2.0000 | ORAL_TABLET | Freq: Three times a day (TID) | ORAL | Status: DC
  Administered 2014-12-14 – 2014-12-22 (×23): 2 via ORAL
  Filled 2014-12-14 (×23): qty 2

## 2014-12-14 MED ORDER — POTASSIUM CHLORIDE CRYS ER 10 MEQ PO TBCR
10.0000 meq | EXTENDED_RELEASE_TABLET | Freq: Every day | ORAL | Status: DC
  Administered 2014-12-15 – 2014-12-22 (×8): 10 meq via ORAL
  Filled 2014-12-14 (×8): qty 1

## 2014-12-14 MED ORDER — FERROUS SULFATE 325 (65 FE) MG PO TABS
650.0000 mg | ORAL_TABLET | Freq: Two times a day (BID) | ORAL | Status: DC
  Administered 2014-12-14 – 2014-12-21 (×14): 650 mg via ORAL
  Filled 2014-12-14 (×14): qty 2

## 2014-12-14 MED ORDER — ALBUMIN HUMAN 25 % IV SOLN
25.0000 g | Freq: Once | INTRAVENOUS | Status: AC
  Administered 2014-12-14: 25 g via INTRAVENOUS
  Filled 2014-12-14: qty 100

## 2014-12-14 NOTE — H&P (Signed)
[facility] [place] NAME: Gwendolyn Norman    MR#:  [id number]  DATE OF BIRTH:  09-Mar-1932  DATE OF ADMISSION:  12/14/2014  PRIMARY CARE PHYSICIAN: Sherrin Daisy, MD   REQUESTING/REFERRING PHYSICIAN: Dr. Charlotte Crumb  CHIEF COMPLAINT:   Chief Complaint  Patient presents with  . Abnormal Lab    HISTORY OF PRESENT ILLNESS:  Gwendolyn Norman  is a 81 y.o. female with a known history of rheumatic heart disease, mitral valve replacement surgery with a mechanical valve, anemia of chronic disease, congestive heart failure, chronic lower extremity edema, hypertension, atrial fibrillation on Coumadin presents to the hospital secondary to weakness. Also labs done as an outpatient showing hypocalcemia and anemia. Patient was in the emergency room last week for UTI and was discharged on [place]. She says her symptoms have improved since then, however has been feeling extremely weak. She has seen her primary care physician and had labs ordered 2 days ago. Labs indicate anemia with drop of hemoglobin to 7.7 now, whereas her baseline is around 9. Her INR on Coumadin is greater than 6 day. Denies any active bleeding at this time. She has been having dark stools for a long time due to being on iron. Guaiac test here is positive. Also patient is a Jehovah witness and refuses blood transfusion. She is hemodynamically stable. The urine test is still pending.  PAST MEDICAL HISTORY:   Past Medical History  Diagnosis Date  . CHF (congestive heart failure)     diastolic dysfunction  . Parkinson disease   . Hypertension   . Osteoarthritis   . Rheumatic heart disease   . H/O mitral valve replacement with mechanical valve   . GERD (gastroesophageal reflux disease)   . CVA (cerebral infarction)     No residual neuro deficits  . Atrial fibrillation   . Pulmonary hypertension     PAST SURGICAL HISTORY:   Past Surgical History  Procedure Laterality  Date  . Mitral valve replacement      SOCIAL HISTORY:   Social History  Substance Use Topics  . Smoking status: Never Smoker   . Smokeless tobacco: Never Used  . Alcohol Use: No    FAMILY HISTORY:   Family History  Problem Relation Age of Onset  . Cancer Mother     oral cavity  . CAD Father     DRUG ALLERGIES:   Allergies  Allergen Reactions  . Penicillins Shortness Of Breath and Other (See Comments)    Has patient had a PCN reaction causing immediate rash, facial/tongue/throat swelling, SOB or lightheadedness with hypotension: Yes Has patient had a PCN reaction causing severe rash involving mucus membranes or skin necrosis: No Has patient had a PCN reaction that required hospitalization No Has patient had a PCN reaction occurring within the last 10 years: No If all of the above answers are "NO", then may proceed with Cephalosporin use.   Marland Kitchen Doxycycline Rash    REVIEW OF SYSTEMS:   Review of Systems  Constitutional: Positive for malaise/fatigue. Negative for fever, chills and weight loss.  HENT: Positive for hearing loss. Negative for ear discharge, ear pain, nosebleeds and tinnitus.   Eyes: Negative for blurred vision, double vision and photophobia.  Respiratory: Negative for cough, hemoptysis, shortness of breath and wheezing.   Cardiovascular: Positive for leg swelling. Negative for chest pain, palpitations and orthopnea.  Gastrointestinal: Positive for melena. Negative for heartburn, nausea, vomiting, abdominal pain, diarrhea and constipation.  Genitourinary: Negative  for dysuria, urgency, frequency and hematuria.  Musculoskeletal: Negative for myalgias, back pain and neck pain.  Skin: Negative for rash.  Neurological: Positive for dizziness and weakness. Negative for tingling, tremors, sensory change, speech change, focal weakness and headaches.  Endo/Heme/Allergies: Does not bruise/bleed easily.  Psychiatric/Behavioral: Negative for depression.    MEDICATIONS  AT HOME:   Prior to Admission medications   Medication Sig Start Date End Date Taking? Authorizing Provider  acetaminophen (TYLENOL) 500 MG tablet Take 500-1,000 mg by mouth every 6 (six) hours as needed for mild pain or headache.    Yes Historical Provider, MD  amLODipine (NORVASC) 5 MG tablet Take 5 mg by mouth daily.   Yes Historical Provider, MD  Calcium Carb-Cholecalciferol (CALCIUM 600+D3) 600-200 MG-UNIT TABS Take 1 tablet by mouth 2 (two) times daily.   Yes Historical Provider, MD  carbidopa-levodopa (SINEMET IR) 25-100 MG per tablet Take 2 tablets by mouth 3 (three) times daily.   Yes Historical Provider, MD  ferrous sulfate 325 (65 FE) MG tablet Take 650 mg by mouth 2 (two) times daily.    Yes Historical Provider, MD  folic acid (FOLVITE) 1 MG tablet Take 1 mg by mouth daily.   Yes Historical Provider, MD  furosemide (LASIX) 20 MG tablet Take 1 tablet (20 mg total) by mouth daily. 12/08/14 01/07/15 Yes Daymon Larsen, MD  metoprolol (LOPRESSOR) 50 MG tablet Take 50 mg by mouth 2 (two) times daily.   Yes Historical Provider, MD  nitrofurantoin (MACRODANTIN) 100 MG capsule Take 1 capsule (100 mg total) by mouth 2 (two) times daily. 12/08/14  Yes Daymon Larsen, MD  pantoprazole (PROTONIX) 40 MG tablet Take 40 mg by mouth daily.    Yes Historical Provider, MD  vitamin B-12 (CYANOCOBALAMIN) 1000 MCG tablet Take 1,000 mcg by mouth daily.   Yes Historical Provider, MD  warfarin (COUMADIN) 2 MG tablet Take 2 mg by mouth 4 (four) times a week. Pt takes on Thursday, Friday, Saturday, and Sunday.   Yes Historical Provider, MD  warfarin (COUMADIN) 3 MG tablet Take 3 mg by mouth 3 (three) times a week. Pt takes on Monday, Tuesday, and Wednesday.   Yes Historical Provider, MD      VITAL SIGNS:  Blood pressure 116/69, pulse 84, temperature 98 F (36.7 C), temperature source Oral, resp. rate 20, height 5\' 7"  (1.702 m), weight 58.968 kg (130 lb), SpO2 100 %.  PHYSICAL EXAMINATION:   Physical  Exam  GENERAL:  81 y.o.-year-old patient lying in the bed with no acute distress.  EYES: Pupils equal, round, reactive to light and accommodation. No scleral icterus. Extraocular muscles intact.  HEENT: Head atraumatic, normocephalic. Oropharynx and nasopharynx clear. Patient is hard of hearing. NECK:  Supple, no jugular venous distention. No thyroid enlargement, no tenderness.  LUNGS: Normal breath sounds bilaterally, no wheezing, rales,rhonchi or crepitation. No use of accessory muscles of respiration. Decreased bibasilar breath sounds CARDIOVASCULAR: S1, S2 normal. No  rubs, or gallops. 3/6 systolic murmur present in the aortic area and also a loud murmur in the mitral area from her replaced mechanical valve ABDOMEN: Soft, nontender, nondistended. Bowel sounds present. No organomegaly or mass.  EXTREMITIES: No cyanosis, or clubbing. Has 4+ edema from her feet all the way up to her thighs. Also 1-2+ edema of the upper extremities noted. Some redness, more on the right leg noted and she is in compression stockings, healed hours ulcers on the toes noted. NEUROLOGIC: Cranial nerves II through XII are intact. Muscle strength 5/5  in all extremities. Sensation intact. Gait not checked.  PSYCHIATRIC: The patient is alert and oriented x 3.  SKIN: No obvious rash, lesion, or ulcer.   LABORATORY PANEL:   CBC  Recent Labs Lab 12/14/14 1225  WBC 10.3  HGB 7.7*  HCT 23.7*  PLT 233   ------------------------------------------------------------------------------------------------------------------  Chemistries   Recent Labs Lab 12/14/14 1225  NA 130*  K 3.1*  CL 93*  CO2 29  GLUCOSE 97  BUN 18  CREATININE 0.59  CALCIUM 6.8*  AST 18  ALT <5*  ALKPHOS 105  BILITOT 0.6   ------------------------------------------------------------------------------------------------------------------  Cardiac Enzymes  Recent Labs Lab 12/08/14 1423  TROPONINI <0.03    ------------------------------------------------------------------------------------------------------------------  RADIOLOGY:  No results found.  EKG:   Orders placed or performed during the hospital encounter of 12/14/14  . EKG 12-Lead  . EKG 12-Lead  . EKG 12-Lead  . EKG 12-Lead  . EKG 12-Lead  . EKG 12-Lead    IMPRESSION AND PLAN:   Gwendolyn Norman  is a 81 y.o. female with a known history of rheumatic heart disease, mitral valve replacement surgery with a mechanical valve, anemia of chronic disease, congestive heart failure, chronic lower extremity edema, hypertension, atrial fibrillation on Coumadin presents to the hospital secondary to weakness. Also labs done as an outpatient showing hypocalcemia and anemia.  1. Acute on chronic anemia-likely from supratherapeutic INR. No active bleeding at this time. -Stools are guaiac positive, but INR is high. So vitamin K has been ordered -No indication to reverse the INR at this time. Continue to monitor carefully. -Patient is a Jehovah witness. Anemia tests have been ordered, if needed will need to give IV iron or erythropoietin stimulating factors -Continue oral iron supplements  2. Hypocalcemia-ionized calcium has been ordered. -Likely from hypoalbuminemia. Corrected calcium is within normal limits. -Continue oral supplements for now.  3. Chronic lower extremity edema- -Continue compression stockings. Only has diastolic CHF based on her echo last year which showed normal ejection fraction. Likely has significant venous insufficiency. -Also from hypoalbuminemia. Check urine for any protein -Continue low-dose Lasix with potassium supplements -Get Dopplers to rule out DVT -We'll give IV albumen once.  4. Recent UTI-repeat urine analysis ordered. She has been taking Macrobid for almost 5 days. Hold Macrobid for now. -If UA indicative of infection, will start antibiotics  5. Mitral valve replacement status post mechanical  valve- -INR supratherapeutic at this time. Received vitamin K today due to her anemia. -Consult cardiology  6. Parkinson's disease-on Sinemet at home, no recent worsening of symptoms. Will continue the medication  7. Hypertension-continue her Norvasc and metoprolol  8. GERD-continue Protonix  9. DVT prophylaxis-fully anticoagulated as INR is supratherapeutic at this time.  10. Hypokalemia-being replaced  Physical therapy consult requested. At baseline uses a walker for ambulation.  All the records are reviewed and case discussed with ED provider. Management plans discussed with the patient, family and they are in agreement.  CODE STATUS: Full Code  TOTAL TIME TAKING CARE OF THIS PATIENT: 50 minutes.    KALISETTI,RADHIKA M.D on 12/14/2014 at 2:43 PM  Between 7am to 6pm - Pager - [phone]  After 6pm go to [email or web address] - password [email or web address] [facility]  [facility]  Office  [phone]  CC: Primary care physician; Sherrin Daisy, MD

## 2014-12-14 NOTE — ED Notes (Signed)
Patient is resting comfortably. 

## 2014-12-14 NOTE — ED Notes (Signed)
Daughter reports pt was sent here from PCP for abnormal lab values. Pt with +2 pitting edema to bilateral arms and upper legs.

## 2014-12-14 NOTE — Progress Notes (Signed)
MD, Dr. Jannifer Franklin called to see if metoprolol 50 mg appropriate for pts BP.  MD advised to still give medication due to afib.  Will monitor BP Luanna Salk H

## 2014-12-14 NOTE — Progress Notes (Addendum)
Pt. admitted to unit, [id number]. Fall safety plan reviewed, contract signed and placed on wall, bed alarm on. Focused assessment to Epic. Will continue to monitor.  MD paged for critical troponin, no guiac stool sample ordered, and possible need for cxr. Awaiting call back.  Update: 7:06 PM Kalisetti called back, CXR ordered, guiac stool was positive at family doctors office. md acknowledged troponin.

## 2014-12-14 NOTE — ED Notes (Signed)
Family at bedside. 

## 2014-12-14 NOTE — ED Provider Notes (Addendum)
[facility] Emergency Department Provider Note  ____________________________________________  Time seen: Approximately 1:56 PM  I have reviewed the triage vital signs and the nursing notes.   HISTORY  Chief Complaint Abnormal Lab    HPI Gwendolyn Norman is a 81 y.o. female presents today complaining of feeling generally weak. The patient states that she has had black stool for a very long time because she is on iron pills for her chronic anemia. The patient was seen here last week for generalized edema which is likely secondary both to CHF which she has and her hypoalbuminemia, and she was given some Lasix and had a decrease in her swelling in her or showed edema with her compression stockings but still has generalized edema. She denies shortness of breath. She has not been walking much. She has a [employer] valve, is on Coumadin for that and her INR is elevated to her primary care doctor decreased her Coumadin dosing. The patient also recently was seen to have a urinary tract infection and was taking Macrobid. She denies any fever or dysuria. She was sent into the hospital today after blood work was drawn by her doctor showing increased over her baseline anemia and elevated INR. Patient states she feels somewhat weak generally but no focal complaints. Of note, patient is a Sales promotion account executive Witness, and she does not wish any blood products.  Past Medical History  Diagnosis Date  . CHF (congestive heart failure)   . Parkinson disease   . Hypertension   . Osteoarthritis     There are no active problems to display for this patient.   Past Surgical History  Procedure Laterality Date  . Cardiac valve replacement      Current Outpatient Rx  Name  Route  Sig  Dispense  Refill  . acetaminophen (TYLENOL) 500 MG tablet   Oral   Take 500 mg by mouth every 6 (six) hours as needed.         Marland Kitchen amLODipine (NORVASC) 5 MG tablet   Oral   Take 5 mg by mouth daily.          . Calcium Carb-Cholecalciferol (CALCIUM 600 + D) 600-200 MG-UNIT TABS   Oral   Take 1 tablet by mouth 2 (two) times daily.         . ferrous sulfate 325 (65 FE) MG tablet   Oral   Take 325 mg by mouth 2 (two) times daily with a meal.         . folic acid (FOLVITE) 1 MG tablet   Oral   Take 1 mg by mouth daily.         . furosemide (LASIX) 20 MG tablet   Oral   Take 1 tablet (20 mg total) by mouth daily.   30 tablet   1   . metoprolol (LOPRESSOR) 50 MG tablet   Oral   Take 50 mg by mouth 2 (two) times daily.         . nitrofurantoin (MACRODANTIN) 100 MG capsule   Oral   Take 1 capsule (100 mg total) by mouth 2 (two) times daily.   14 capsule   0   . pantoprazole (PROTONIX) 40 MG tablet   Oral   Take 40 mg by mouth 2 (two) times daily.         . vitamin B-12 (CYANOCOBALAMIN) 1000 MCG tablet   Oral   Take 1,000 mcg by mouth daily.         Marland Kitchen  warfarin (COUMADIN) 2 MG tablet   Oral   Take 2 mg by mouth daily. Saturday-Sunday         . warfarin (COUMADIN) 3 MG tablet   Oral   Take 3 mg by mouth daily. Monday-Friday           Allergies Penicillins and Doxycycline  No family history on file.  Social History Social History  Substance Use Topics  . Smoking status: Never Smoker   . Smokeless tobacco: None  . Alcohol Use: No    Review of Systems Constitutional: No fever/chills Eyes: No visual changes. ENT: No sore throat. Cardiovascular: Denies chest pain. Respiratory: Denies shortness of breath. Gastrointestinal: No abdominal pain.  No nausea, no vomiting.  No diarrhea.  No constipation. Genitourinary: Negative for dysuria. Musculoskeletal: Negative for back pain. Positive for diffuse upper and lower extremity swelling Skin: Negative for rash. Neurological: Negative for headaches, focal weakness or numbness.  10-point ROS otherwise negative.  ____________________________________________   PHYSICAL EXAM:  VITAL SIGNS: ED Triage  Vitals  Enc Vitals Group     BP 12/14/14 1222 116/69 mmHg     Pulse Rate 12/14/14 1222 84     Resp 12/14/14 1222 20     Temp 12/14/14 1222 98 F (36.7 C)     Temp Source 12/14/14 1222 Oral     SpO2 12/14/14 1222 100 %     Weight 12/14/14 1222 130 lb (58.968 kg)     Height 12/14/14 1222 5\' 7"  (1.702 m)     Head Cir --      Peak Flow --      Pain Score --      Pain Loc --      Pain Edu? --      Excl. in [place]? --     Constitutional: Alert and oriented, nontoxic, and given her blood work quite well appearing although pale woman in no acute distress Eyes: Conjunctivae are pale. PERRL. EOMI. Head: Atraumatic. Nose: No congestion/rhinnorhea. Mouth/Throat: Mucous membranes are moist.  Oropharynx non-erythematous. Neck: No stridor.   Cardiovascular: Normal rate, regular rhythm. Grossly normal heart sounds with mechanical click appreciated.  Good peripheral circulation. Respiratory: Normal respiratory effort.  No retractions. Lungs CTAB. Gastrointestinal: Soft and nontender. No distention. No abdominal bruits. No CVA tenderness. Rectal: Black guaiac positive stool noted no frank red blood per rectum female nurse chaperone present Musculoskeletal: No lower extremity tenderness nor edema.  No joint effusions. Neurologic:  Normal speech and language. No gross focal neurologic deficits are appreciated. No gait instability. Skin:  Skin is warm, dry and intact. There is a early decubitus ulcer noted on her buttock does not appear infected Psychiatric: Mood and affect are normal. Speech and behavior are normal.  ____________________________________________   LABS (all labs ordered are listed, but only abnormal results are displayed)  Labs Reviewed  CBC - Abnormal; Notable for the following:    RBC 2.86 (*)    Hemoglobin 7.7 (*)    HCT 23.7 (*)    RDW 19.6 (*)    All other components within normal limits  COMPREHENSIVE METABOLIC PANEL - Abnormal; Notable for the following:    Sodium 130  (*)    Potassium 3.1 (*)    Chloride 93 (*)    Calcium 6.8 (*)    Total Protein 5.1 (*)    Albumin 1.2 (*)    ALT <5 (*)    All other components within normal limits  PROTIME-INR - Abnormal; Notable for the following:  Prothrombin Time 57.3 (*)    INR 6.61 (*)    All other components within normal limits  URINE CULTURE  BRAIN NATRIURETIC PEPTIDE  CALCIUM, IONIZED  URINALYSIS COMPLETEWITH MICROSCOPIC ([facility] ONLY)  TROPONIN I  TYPE AND SCREEN   ____________________________________________  EKG I personally reviewed this EKG rate 87 bpm, atrial flutter baseline, no acute ST elevation or depression to C4 57  ____________________________________________  RADIOLOGY I have reviewed x-rays  ____________________________________________   PROCEDURES  Procedure(s) performed: None  Critical Care performed: CRITICAL CARE Performed by: Schuyler Amor   Total critical care time: 37 minutes  Critical care time was exclusive of separately billable procedures and treating other patients.  Critical care was necessary to treat or prevent imminent or life-threatening deterioration. Including GI bleed  Critical care was time spent personally by me on the following activities: development of treatment plan with patient and/or surrogate as well as nursing, discussions with consultants, evaluation of patient's response to treatment, examination of patient, obtaining history from patient or surrogate, ordering and performing treatments and interventions, ordering and review of laboratory studies, ordering and review of radiographic studies, pulse oximetry and re-evaluation of patient's condition.   ____________________________________________   INITIAL IMPRESSION / ASSESSMENT AND PLAN / ED COURSE  Pertinent labs & imaging results that were available during my care of the patient were reviewed by me and considered in my medical decision making (see chart for details).  Unfortunately,  complicated patient with a history of CHF, anasarca, low albumin, black stools from iron pills, UTI, and supratherapeutic INR on Coumadin presents today feeling generally weak. 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 Her vital signs are stable. This is acute on chronic issue. I've given her vitamin K to reverse her elevated INR. I am reluctant to use more aggressive medication for this as she is a stroke risk and her vital signs are stable. I have discussed extensively with the hospitalist service, and they agree with this plan and will admit. They do not feel any other intervention is needed at this time. Her urine is pending. Her potassium is low and we will supplement that. Her calcium appears level denies calcium will need to be obtained because with correction for albumin the level is reassuring. EKG is pending. Patient in no acute distress. We will noted to the hospital at the request of her primary care doctor and for obvious reasons given her anemia and elevated INR with guaiac-positive stool. ____________________________________________   FINAL CLINICAL IMPRESSION(S) / ED DIAGNOSES  Final diagnoses:  Gastrointestinal hemorrhage with melena     Schuyler Amor, MD 12/14/14 1401  Schuyler Amor, MD 12/14/14 [phone]

## 2014-12-14 NOTE — ED Notes (Signed)
Dr. Edd Fabian notified of INR 6.6 and elevated PT. Charge aware to assign bed.

## 2014-12-15 ENCOUNTER — Other Ambulatory Visit: Payer: Self-pay | Admitting: Nurse Practitioner

## 2014-12-15 ENCOUNTER — Encounter: Payer: Self-pay | Admitting: Gastroenterology

## 2014-12-15 DIAGNOSIS — C189 Malignant neoplasm of colon, unspecified: Secondary | ICD-10-CM | POA: Diagnosis not present

## 2014-12-15 LAB — CALCIUM, IONIZED: Calcium, Ionized, Serum: 4.3 mg/dL — ABNORMAL LOW (ref 4.5–5.6)

## 2014-12-15 LAB — CBC
HCT: 22.2 % — ABNORMAL LOW (ref 35.0–47.0)
Hemoglobin: 7.5 g/dL — ABNORMAL LOW (ref 12.0–16.0)
MCH: 28.4 pg (ref 26.0–34.0)
MCHC: 34 g/dL (ref 32.0–36.0)
MCV: 83.7 fL (ref 80.0–100.0)
PLATELETS: 217 10*3/uL (ref 150–440)
RBC: 2.65 MIL/uL — ABNORMAL LOW (ref 3.80–5.20)
RDW: 20.3 % — AB (ref 11.5–14.5)
WBC: 7.6 10*3/uL (ref 3.6–11.0)

## 2014-12-15 LAB — PROTIME-INR
INR: 1.55
PROTHROMBIN TIME: 18.8 s — AB (ref 11.4–15.0)

## 2014-12-15 LAB — FOLATE: Folate: 21.7 ng/mL (ref >5.9)

## 2014-12-15 LAB — FERRITIN: FERRITIN: 123 ng/mL (ref 11–307)

## 2014-12-15 LAB — BASIC METABOLIC PANEL
Anion gap: 6 (ref 5–15)
BUN: 14 mg/dL (ref 6–20)
CO2: 30 mmol/L (ref 22–32)
CREATININE: 0.51 mg/dL (ref 0.44–1.00)
Calcium: 7 mg/dL — ABNORMAL LOW (ref 8.9–10.3)
Chloride: 95 mmol/L — ABNORMAL LOW (ref 101–111)
GFR calc Af Amer: >60 mL/min (ref >60)
Glucose, Bld: 76 mg/dL (ref 65–99)
Potassium: 3.3 mmol/L — ABNORMAL LOW (ref 3.5–5.1)
Sodium: 131 mmol/L — ABNORMAL LOW (ref 135–145)

## 2014-12-15 LAB — VITAMIN B12: Vitamin B-12: 1360 pg/mL — ABNORMAL HIGH (ref 180–914)

## 2014-12-15 LAB — IRON AND TIBC
IRON: 55 ug/dL (ref 28–170)
Saturation Ratios: 47 % — ABNORMAL HIGH (ref 10.4–31.8)
TIBC: 117 ug/dL — ABNORMAL LOW (ref 250–450)
UIBC: 62 ug/dL

## 2014-12-15 LAB — HEMOGLOBIN: HEMOGLOBIN: 7.2 g/dL — AB (ref 12.0–16.0)

## 2014-12-15 MED ORDER — WARFARIN SODIUM 1 MG PO TABS: 2.0000 mg | ORAL_TABLET | Freq: Every day | ORAL | Status: DC

## 2014-12-15 MED ORDER — WARFARIN - PHARMACIST DOSING INPATIENT: Freq: Every day | Status: DC

## 2014-12-15 MED ORDER — CETYLPYRIDINIUM CHLORIDE 0.05 % MT LIQD
7.0000 mL | Freq: Two times a day (BID) | OROMUCOSAL | Status: DC
  Administered 2014-12-15 – 2014-12-22 (×14): 7 mL via OROMUCOSAL

## 2014-12-15 NOTE — Progress Notes (Signed)
[facility] [place] NAME: Gwendolyn Norman    MR#:  [id number]  DATE OF BIRTH:  [birth year]  SUBJECTIVE:  CHIEF COMPLAINT:   Chief Complaint  Patient presents with  . Abnormal Lab   No acute complaints. No abdominal pain. Poor historian  REVIEW OF SYSTEMS:   Review of Systems  Constitutional: Negative for fever.  Respiratory: Negative for shortness of breath.   Cardiovascular: Negative for chest pain and palpitations.  Gastrointestinal: Negative for nausea, vomiting and abdominal pain.  Genitourinary: Negative for dysuria.    DRUG ALLERGIES:   Allergies  Allergen Reactions  . Penicillins Shortness Of Breath and Other (See Comments)    Has patient had a PCN reaction causing immediate rash, facial/tongue/throat swelling, SOB or lightheadedness with hypotension: Yes Has patient had a PCN reaction causing severe rash involving mucus membranes or skin necrosis: No Has patient had a PCN reaction that required hospitalization No Has patient had a PCN reaction occurring within the last 10 years: No If all of the above answers are "NO", then may proceed with Cephalosporin use.   Marland Kitchen Doxycycline Rash    VITALS:  Blood pressure 113/59, pulse 81, temperature 97.6 F (36.4 C), temperature source Oral, resp. rate 18, height 5\' 7"  (1.702 m), weight 63.549 kg (140 lb 1.6 oz), SpO2 98 %.  PHYSICAL EXAMINATION:  GENERAL:  81 y.o.-year-old patient lying in the bed with no acute distress.  LUNGS: Normal breath sounds bilaterally, no wheezing, rales,rhonchi or crepitation. No use of accessory muscles of respiration.  CARDIOVASCULAR: S1, S2 normal. No murmurs, rubs, or gallops.  ABDOMEN: Soft, nontender, nondistended. Bowel sounds present. No organomegaly or mass. No guarding no rebound EXTREMITIES: No pedal edema, cyanosis, or clubbing.  NEUROLOGIC: Cranial nerves II through XII are intact. Muscle strength 5/5 in all extremities. Sensation  intact. Gait not checked.  PSYCHIATRIC: The patient is alert and oriented to person, poor historian SKIN: No obvious rash, lesion, or ulcer.    LABORATORY PANEL:   CBC  Recent Labs Lab 12/15/14 0647  WBC 7.6  HGB 7.5*  HCT 22.2*  PLT 217   ------------------------------------------------------------------------------------------------------------------  Chemistries   Recent Labs Lab 12/14/14 1225 12/15/14 0647  NA 130* 131*  K 3.1* 3.3*  CL 93* 95*  CO2 29 30  GLUCOSE 97 76  BUN 18 14  CREATININE 0.59 0.51  CALCIUM 6.8* 7.0*  AST 18  --   ALT <5*  --   ALKPHOS 105  --   BILITOT 0.6  --    ------------------------------------------------------------------------------------------------------------------  Cardiac Enzymes  Recent Labs Lab 12/14/14 1657  TROPONINI 0.05*   ------------------------------------------------------------------------------------------------------------------  RADIOLOGY:  Dg Chest 2 View  12/14/2014   CLINICAL DATA:  81 year old female with 1 year history of cough.  EXAM: CHEST  2 VIEW  COMPARISON:  Prior chest x-ray 12/08/2014  FINDINGS: Unchanged massive enlargement of the cardiopericardial silhouette. Patient is status post median sternotomy. Atherosclerotic calcification present within the transverse aorta. No evidence of pulmonary edema. Small right pleural effusion with associated right basilar atelectasis. No focal scratch then no acute osseous abnormality.  IMPRESSION: 1. Stable chest x-ray with persistent massive enlargement of the cardiopericardial silhouette which may reflect cardiomegaly and/or pericardial effusion. 2. Small right pleural effusion with associated right lower lobe atelectasis. 3. Negative for pulmonary edema.   Electronically Signed   By: Jacqulynn Cadet M.D.   On: 12/14/2014 20:09   US Venous Img Lower Bilateral  12/14/2014   CLINICAL DATA:  Chronic bilateral lower extremity pain and swelling, discoloration.   EXAM: BILATERAL LOWER EXTREMITY VENOUS DOPPLER ULTRASOUND  TECHNIQUE: Gray-scale sonography with graded compression, as well as color Doppler and duplex ultrasound were performed to evaluate the lower extremity deep venous systems from the level of the common femoral vein and including the common femoral, femoral, profunda femoral, popliteal and calf veins including the posterior tibial, peroneal and gastrocnemius veins when visible. The superficial great saphenous vein was also interrogated. Spectral Doppler was utilized to evaluate flow at rest and with distal augmentation maneuvers in the common femoral, femoral and popliteal veins.  COMPARISON:  None.  FINDINGS: RIGHT LOWER EXTREMITY  Common Femoral Vein: No evidence of thrombus. Normal compressibility, respiratory phasicity and response to augmentation.  Saphenofemoral Junction: No evidence of thrombus. Normal compressibility and flow on color Doppler imaging.  Profunda Femoral Vein: No evidence of thrombus. Normal compressibility and flow on color Doppler imaging.  Femoral Vein: No evidence of thrombus. Normal compressibility, respiratory phasicity and response to augmentation.  Popliteal Vein: No evidence of thrombus. Normal compressibility, respiratory phasicity and response to augmentation.  Calf Veins: No evidence of thrombus. Normal compressibility and flow on color Doppler imaging.  Superficial Great Saphenous Vein: No evidence of thrombus. Normal compressibility and flow on color Doppler imaging.  Venous Reflux:  None.  Other Findings: Right lower extremity subcutaneous peripheral edema noted. Right inguinal prominent lymph node measures 18 x 13 x 7 mm.  LEFT LOWER EXTREMITY  Common Femoral Vein: No evidence of thrombus. Normal compressibility, respiratory phasicity and response to augmentation.  Saphenofemoral Junction: No evidence of thrombus. Normal compressibility and flow on color Doppler imaging.  Profunda Femoral Vein: No evidence of thrombus.  Normal compressibility and flow on color Doppler imaging.  Femoral Vein: No evidence of thrombus. Normal compressibility, respiratory phasicity and response to augmentation.  Popliteal Vein: No evidence of thrombus. Normal compressibility, respiratory phasicity and response to augmentation.  Calf Veins: No evidence of thrombus. Normal compressibility and flow on color Doppler imaging.  Superficial Great Saphenous Vein: No evidence of thrombus. Normal compressibility and flow on color Doppler imaging.  Venous Reflux:  None.  Other Findings: Similar left lower extremity subcutaneous edema noted.  IMPRESSION: Negative for significant DVT in either extremity.  Peripheral subcutaneous edema  Borderline right inguinal adenopathy   Electronically Signed   By: Jerilynn Mages.  Shick M.D.   On: 12/14/2014 16:46    EKG:   Orders placed or performed during the hospital encounter of 12/14/14  . EKG 12-Lead  . EKG 12-Lead  . EKG 12-Lead  . EKG 12-Lead  . EKG 12-Lead  . 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  Greater than 50% of time spent in care coordination and  counseling. POSSIBLE D/C IN 1-2 DAYS, DEPENDING ON CLINICAL CONDITION.   Myrtis Ser M.D on 12/15/2014 at 2:48 PM  Between 7am to 6pm - Pager - [phone]  After 6pm go to [email or web address] - password [email or web address]  [facility]  Office  [phone]  CC: Primary care physician; Sherrin Daisy, MD

## 2014-12-15 NOTE — Consult Note (Signed)
GI Inpatient Consult Note  Reason for Consult: Heme positive stool and Anemia.   Attending Requesting Consult: Dr. Volanda Napoleon  History of Present Illness: Gwendolyn Norman is a 81 y.o. female who presented to the [facility] ER for evaluation of generalize weakness stating she wasn't able to get around at home. She has a history of Chronic Anemia with a baseline around 9 gm/dl.  Hemoglobin noted to be 7.7 gm/dl with heme positive stool.  Hemoglobin is 7.5 gm/dl today. It was 7.2 gm/dl on yesterday.  She denies any bloody or dark stools since admission. Denies any abdominal pain. Says she had an EGD approximately 4 years ago. Doesn't remember the results or who performed it.   Patient was taken Coumadin for Atrial Fib diagnosis. INR 6.1 in the ER. This is thought to be elevated due to Macrobid that she was taking to treat a UTI diagnosed in the ER last week.  Coumadin has been held. Today's INR is 1.55. She was also given Vitamin K.  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     Past Medical History:  Past Medical History  Diagnosis Date  . CHF (congestive heart failure)     diastolic dysfunction  . Parkinson disease   . Hypertension   . Osteoarthritis   . Rheumatic heart disease   . H/O mitral valve replacement with mechanical valve   . GERD (gastroesophageal reflux disease)   . CVA (cerebral infarction)     No residual neuro deficits  . Atrial fibrillation   . Pulmonary hypertension     Problem List: Patient Active Problem List   Diagnosis Date Noted  . Anemia 12/14/2014  . Pressure ulcer 12/14/2014    Past Surgical History: Past Surgical History  Procedure Laterality Date  . Mitral valve replacement      Allergies: Allergies  Allergen Reactions  . Penicillins Shortness Of Breath and Other (See Comments)    Has patient had a PCN reaction causing immediate rash, facial/tongue/throat swelling, SOB or lightheadedness with hypotension: Yes Has patient had  a PCN reaction causing severe rash involving mucus membranes or skin necrosis: No Has patient had a PCN reaction that required hospitalization No Has patient had a PCN reaction occurring within the last 10 years: No If all of the above answers are "NO", then may proceed with Cephalosporin use.   Marland Kitchen Doxycycline Rash    Home Medications: Prescriptions prior to admission  Medication Sig Dispense Refill Last Dose  . acetaminophen (TYLENOL) 500 MG tablet Take 500-1,000 mg by mouth every 6 (six) hours as needed for mild pain or headache.    PRN at PRN  . amLODipine (NORVASC) 5 MG tablet Take 5 mg by mouth daily.   12/14/2014 at Unknown time  . Calcium Carb-Cholecalciferol (CALCIUM 600+D3) 600-200 MG-UNIT TABS Take 1 tablet by mouth 2 (two) times daily.   12/14/2014 at Unknown time  . carbidopa-levodopa (SINEMET IR) 25-100 MG per tablet Take 2 tablets by mouth 3 (three) times daily.   12/14/2014 at Unknown time  . ferrous sulfate 325 (65 FE) MG tablet Take 650 mg by mouth 2 (two) times daily.    12/14/2014 at Unknown time  . folic acid (FOLVITE) 1 MG tablet Take 1 mg by mouth daily.   12/14/2014 at Unknown time  . furosemide (LASIX) 20 MG tablet Take 1 tablet (20 mg total) by mouth daily. 30 tablet 1 12/14/2014 at Unknown time  . metoprolol (LOPRESSOR) 50 MG tablet Take 50 mg  by mouth 2 (two) times daily.   12/14/2014 at [place]  . nitrofurantoin (MACRODANTIN) 100 MG capsule Take 1 capsule (100 mg total) by mouth 2 (two) times daily. 14 capsule 0 12/14/2014 at Unknown time  . pantoprazole (PROTONIX) 40 MG tablet Take 40 mg by mouth daily.    12/14/2014 at Unknown time  . vitamin B-12 (CYANOCOBALAMIN) 1000 MCG tablet Take 1,000 mcg by mouth daily.   12/14/2014 at Unknown time  . warfarin (COUMADIN) 2 MG tablet Take 2 mg by mouth 4 (four) times a week. Pt takes on Thursday, Friday, Saturday, and Sunday.   Past Week at Unknown time  . warfarin (COUMADIN) 3 MG tablet Take 3 mg by mouth 3 (three) times a week. Pt  takes on Monday, Tuesday, and Wednesday.   12/13/2014 at [facility] medication reconciliation was completed with the patient.   Scheduled Inpatient Medications:   . amLODipine  5 mg Oral Daily  . antiseptic oral rinse  7 mL Mouth Rinse BID  . calcium-vitamin D   Oral BID  . carbidopa-levodopa  2 tablet Oral TID  . ferrous sulfate  650 mg Oral BID  . folic acid  1 mg Oral Daily  . furosemide  20 mg Oral Daily  . metoprolol  50 mg Oral BID  . pantoprazole  40 mg Oral Daily  . potassium chloride  10 mEq Oral Daily  . sodium chloride  3 mL Intravenous Q12H  . vitamin B-12  1,000 mcg Oral Daily    Continuous Inpatient Infusions:   None  PRN Inpatient Medications:  acetaminophen, senna-docusate  Family History: family history includes CAD in her father; Cancer in her mother.  The patient's family history is negative for inflammatory bowel disorders, GI malignancy, or solid organ transplantation.  Social History:   reports that she has never smoked. She has never used smokeless tobacco. She reports that she does not drink alcohol or use illicit drugs.   Review of Systems: Constitutional: Weight is stable.  Eyes: No changes in vision. ENT: No oral lesions, sore throat.  GI: see HPI.  Heme/Lymph: easy bruising. Skin very thin. Was on Coumadin. CV: No chest pain.  GU: No hematuria.  Integumentary: lower extremity hyperpigmentation. Neuro: No headaches.  Psych: No depression/anxiety.  Endocrine: No heat/cold intolerance.  Allergic/Immunologic: No urticaria.  Resp: No cough, SOB.  Musculoskeletal: No joint swelling.    Physical Examination: BP 113/59 mmHg  Pulse 81  Temp(Src) 97.6 F (36.4 C) (Oral)  Resp 18  Ht 5\' 7"  (1.702 m)  Wt 63.549 kg (140 lb 1.6 oz)  BMI 21.94 kg/m2  SpO2 98% Gen: NAD, alert and oriented x 4. Thin appearing.  HEENT: PEERLA, EOMI, Neck: supple, no JVD or thyromegaly Chest: CTA bilaterally, no wheezes, crackles, or other adventitious  sounds CV: RRR, Systolic Murmur.  Abd: soft, NT, ND, +BS in all four quadrants; no HSM, guarding, ridigity, or rebound tenderness Ext: bilateral 1-2+ bilateral lower extremity edema.  Skin: hyperpigmented lower legs. Seen in chronic lower extremity edema.    Data: Lab Results  Component Value Date   WBC 7.6 12/15/2014   HGB 7.5* 12/15/2014   HCT 22.2* 12/15/2014   MCV 83.7 12/15/2014   PLT 217 12/15/2014    Recent Labs Lab 12/14/14 1657 12/14/14 2312 12/15/14 0647  HGB 7.7* 7.2* 7.5*   Lab Results  Component Value Date   NA 131* 12/15/2014   K 3.3* 12/15/2014   CL 95* 12/15/2014   CO2 30 12/15/2014  BUN 14 12/15/2014   CREATININE 0.51 12/15/2014   Lab Results  Component Value Date   ALT <5* 12/14/2014   AST 18 12/14/2014   ALKPHOS 105 12/14/2014   BILITOT 0.6 12/14/2014    Recent Labs Lab 12/08/14 1454  12/15/14 0647  APTT 73*  --   --   INR 4.77*  < > 1.55  < > = values in this interval not displayed.     Assessment/Plan: Gwendolyn Norman is a 81 y.o. female  With appears to be post-hemorrhagic anemia in the setting of a supra-therapeutic INR. Hemoglobin appears to have stabilized after Vitamin K and holding Coumadin.  Previous EGD not available for review.   Recommendations:  Follow H/H closely. Possible EGD to evaluate for source of bleeding. Continue with Protonix.Patient would like to discuss the possibility of EGD with daughter. Will discuss with Dr. Candace Cruise for further recommendations.   Thank you for the consult. Please call with questions or concerns. Patient seen with Dr. Verdie Shire under collaborative agreement.  Faye Ramsay, FNP

## 2014-12-15 NOTE — Progress Notes (Signed)
PT Cancellation Note  Patient Details Name: Gwendolyn Norman MRN: [id number] DOB: 08-Dec-1931   Cancelled Treatment:    Reason Eval/Treat Not Completed: Medical issues which prohibited therapy (awaiting cardiology consult).  May try later if time allows.   Ramond Dial 12/15/2014, 8:44 AM   Mee Hives, PT MS Acute Rehab Dept. Number: [phone] [id number] and [place] [phone]

## 2014-12-15 NOTE — Care Management Note (Signed)
Case Management Note  Patient Details  Name: Gwendolyn Norman MRN: [id number] Date of Birth: February 18, 1932  Subjective/Objective:             Patient admitted with anemia.  Patient lives at home with her daughter.   Patient currently open to [facility] for RN and PT. Mary Notified with [facility] and made aware of admission.   Patient has walker, BSC, and cane in the home.  Patient's daughter transports the patient when needed.  Patient [facility] in [place].  Patient's daughter is currently in process of filing for Medicaid.  RNCM to follow for DC planning       Action/Plan:   Expected Discharge Date:                  Expected Discharge Plan:     In-House Referral:     Discharge planning Services     Post Acute Care Choice:    Choice offered to:     DME Arranged:    DME Agency:     HH Arranged:    [place] Agency:     Status of Service:     Medicare Important Message Given:    Date Medicare IM Given:    Medicare IM give by:    Date Additional Medicare IM Given:    Additional Medicare Important Message give by:     If discussed at [place] of Stay Meetings, dates discussed:    Additional Comments:  Beverly Sessions, RN 12/15/2014, 2:24 PM

## 2014-12-15 NOTE — Consult Note (Signed)
[facility]  CARDIOLOGY CONSULT NOTE  Patient ID: Gwendolyn Norman MRN: [id number] DOB/AGE: 79/22/1933 79 y.o.  Admit date: 12/14/2014 Referring Physician Dr. Volanda Napoleon Primary Physician   Primary Cardiologist Dr. Saralyn Pilar Reason for Consultation prosthetic mitral valve in GI bleed  HPI: The patient is a 81 year old female with history of a [employer]. Jude mitral prosthesis. She was admitted with anemia and dark stools complaining of weakness and fatigue. Patient had been treated for a UTI with antibiotic along with her warfarin and her INR was greater than 6. She had no acute bleeding. Review of her hemoglobins from the office show a gradual decline in her hemoglobin. On presentation she had hemoglobin of 7.7. She had dark stools but was on iron supplement. She was given 10 mg of IV vitamin K and INR has normalized.  ROS Review of Systems - History obtained from chart review and the patient General ROS: positive for  - fatigue Respiratory ROS: no cough, shortness of breath, or wheezing Cardiovascular ROS: no chest pain or dyspnea on exertion Gastrointestinal ROS: no abdominal pain, change in bowel habits, or black or bloody stools Neurological ROS: no TIA or stroke symptoms   Past Medical History  Diagnosis Date  . CHF (congestive heart failure)     diastolic dysfunction  . Parkinson disease   . Hypertension   . Osteoarthritis   . Rheumatic heart disease   . H/O mitral valve replacement with mechanical valve   . GERD (gastroesophageal reflux disease)   . CVA (cerebral infarction)     No residual neuro deficits  . Atrial fibrillation   . Pulmonary hypertension     Family History  Problem Relation Age of Onset  . Cancer Mother     oral cavity  . CAD Father     Social History   Social History  . Marital Status: Married    Spouse Name: N/A  . Number of Children: N/A  . Years of Education: N/A   Occupational History  . Not on file.    Social History Main Topics  . Smoking status: Never Smoker   . Smokeless tobacco: Never Used  . Alcohol Use: No  . Drug Use: No  . Sexual Activity: Not on file   Other Topics Concern  . Not on file   Social History Narrative   Lives at home with daughter    Past Surgical History  Procedure Laterality Date  . Mitral valve replacement       Prescriptions prior to admission  Medication Sig Dispense Refill Last Dose  . acetaminophen (TYLENOL) 500 MG tablet Take 500-1,000 mg by mouth every 6 (six) hours as needed for mild pain or headache.    PRN at PRN  . amLODipine (NORVASC) 5 MG tablet Take 5 mg by mouth daily.   12/14/2014 at Unknown time  . Calcium Carb-Cholecalciferol (CALCIUM 600+D3) 600-200 MG-UNIT TABS Take 1 tablet by mouth 2 (two) times daily.   12/14/2014 at Unknown time  . carbidopa-levodopa (SINEMET IR) 25-100 MG per tablet Take 2 tablets by mouth 3 (three) times daily.   12/14/2014 at Unknown time  . ferrous sulfate 325 (65 FE) MG tablet Take 650 mg by mouth 2 (two) times daily.    12/14/2014 at Unknown time  . folic acid (FOLVITE) 1 MG tablet Take 1 mg by mouth daily.   12/14/2014 at Unknown time  . furosemide (LASIX) 20 MG tablet Take 1 tablet (20 mg total) by mouth daily. [place]  tablet 1 12/14/2014 at Unknown time  . metoprolol (LOPRESSOR) 50 MG tablet Take 50 mg by mouth 2 (two) times daily.   12/14/2014 at [place]  . nitrofurantoin (MACRODANTIN) 100 MG capsule Take 1 capsule (100 mg total) by mouth 2 (two) times daily. 14 capsule 0 12/14/2014 at Unknown time  . pantoprazole (PROTONIX) 40 MG tablet Take 40 mg by mouth daily.    12/14/2014 at Unknown time  . vitamin B-12 (CYANOCOBALAMIN) 1000 MCG tablet Take 1,000 mcg by mouth daily.   12/14/2014 at Unknown time  . warfarin (COUMADIN) 2 MG tablet Take 2 mg by mouth 4 (four) times a week. Pt takes on Thursday, Friday, Saturday, and Sunday.   Past Week at Unknown time  . warfarin (COUMADIN) 3 MG tablet Take 3 mg by mouth 3 (three)  times a week. Pt takes on Monday, Tuesday, and Wednesday.   12/13/2014 at [facility]    Physical Exam: Blood pressure 114/67, pulse 72, temperature 97.9 F (36.6 C), temperature source Oral, resp. rate 16, height 5\' 7"  (1.702 m), weight 63.549 kg (140 lb 1.6 oz), SpO2 100 %.    General appearance: alert and cooperative Resp: clear to auscultation bilaterally Cardio: irregularly irregular rhythm Extremities: extremities normal, atraumatic, no cyanosis or edema Neurologic: Grossly normal Labs:   Lab Results  Component Value Date   WBC 7.6 12/15/2014   HGB 7.5* 12/15/2014   HCT 22.2* 12/15/2014   MCV 83.7 12/15/2014   PLT 217 12/15/2014    Recent Labs Lab 12/14/14 1225 12/15/14 0647  NA 130* 131*  K 3.1* 3.3*  CL 93* 95*  CO2 29 30  BUN 18 14  CREATININE 0.59 0.51  CALCIUM 6.8* 7.0*  PROT 5.1*  --   BILITOT 0.6  --   ALKPHOS 105  --   ALT <5*  --   AST 18  --   GLUCOSE 97 76   Lab Results  Component Value Date   TROPONINI 0.05* 12/14/2014      Radiology: No acute cardiopulmonary process. No pulmonary edema EKG: Atrial fibrillation with controlled ventricular response  ASSESSMENT AND PLAN:  81 year old female with history of St. Jude's prosthetic mitral valve on warfarin. She also has chronic atrial fibrillation. She was admitted with weakness fatigue and a gradual decline in her hemoglobin. She did not appear to have an acute GI bleed. Hemoglobin has drifted down and her stools were dark but she was on iron supplementation. Her INR was markedly prolonged likely secondary to antibiotic some the face of warfarin. She received vitamin K and her R has almost normalized. Would give no more vitamin K. This does not appear to be an acute bleed. Patient will acquire warfarin with an INR between 2.5 and 3.5. Would resume warfarin unless there is any episodes of active GI bleeding. Would continue with iron supplementation. Signed: Teodoro Spray MD, [facility] 12/15/2014, 11:43  AM

## 2014-12-15 NOTE — Progress Notes (Signed)
Pt asked about EGD tomorrow, pt reports she does not think she wants to have procedure.  Pt agreeable to be NPO after midnight in case she changes her mind.  Will continue to monitor. Jessee Avers

## 2014-12-15 NOTE — Consult Note (Signed)
  Pt seen and examined. Please see D. Martin's notes. Poor historian. Initially, she denied having colonoscopy and then later said she had a normal colonoscopy few years back. She told me she had an UGI series in the past, but told Dorine Hassell Done that she had an EGD also. I cannot find any records in [place] to confirm this. Will check our records in GI office later. Had reg diet today. INR down to normal. Stool black but pt had been on Fe. Will plan EGD tomorrow with prophylactic Abx. If EGD negative, then plan colonoscopy on Friday after bowel prep. Would prefer hgb to be above 8 before EGD/colon, but pt is a Jehov[employer]'s Witness and refuses blood transfusions. Will follow. Thanks.

## 2014-12-15 NOTE — Clinical Social Work Note (Signed)
Clinical Social Work Assessment  Patient Details  Name: Gwendolyn Norman MRN: [id number] Date of Birth: [birth year]  Date of referral:  12/15/14               Reason for consult:  Facility Placement                Permission sought to share information with:  Facility Sport and exercise psychologist, Family Supports Permission granted to share information::  Yes, Verbal Permission Granted  Name::     Daughter Gwendolyn Norman [phone]  Agency::  [facility] facilities   Relationship::     Contact Information:     Housing/Transportation Living arrangements for the past 2 months:  [place] of Information:  Patient, Adult Children Patient Interpreter Needed:  None Criminal Activity/Legal Involvement Pertinent to Current Situation/Hospitalization:  No - Comment as needed Significant Relationships:  Adult Children Lives with:  Adult Children, Other (Comment) (granddaughter, daughters husband, and great grand chld ) Do you feel safe going back to the place where you live?  No Need for family participation in patient care:  Yes (Comment)  Care giving concerns:  Pt's current concern is not being able to get enough care at home.  This is why she is intersted in SNF placement.  Current prefernce is [facility].   Social Worker assessment / plan:  CSW spoke to pt and pt's daughter in the room.  Pt lives with her daughter, daughters husband (who also has health problems) her grandaughter and great grand child.  Pt's daughter is in the process of applying for medicaid for pt.  They are in agreement with DC to SNF.  She had been declining in mobility per pt's daughter, and feels she would benefit form SNF placement.  CSW will f/u once offers have been received.  Employment status:  Retired, Disabled (Comment on whether or not currently receiving Disability) Insurance information:  Medicare, Self Pay (Medicaid Pending) (pt's daughter is in the process of applying for medicaid ) PT Recommendations:     Information / Referral to community resources:  [place]  Patient/Family's Response to care:  In agreement with SNF placement.  Patient/Family's Understanding of and Emotional Response to Diagnosis, Current Treatment, and Prognosis:  Verbalized understanding and were in favor of SNF placement.   Emotional Assessment Appearance:  Appears stated age Attitude/Demeanor/Rapport:   (pleasenet and cooroperative) Affect (typically observed):  Appropriate Orientation:  Oriented to Self, Oriented to Place, Oriented to  Time, Oriented to Situation Alcohol / Substance use:  Never Used Psych involvement (Current and /or in the community):  No (Comment)  Discharge Needs  Concerns to be addressed:  Care Coordination Readmission within the last 30 days:  No Current discharge risk:  Physical Impairment Barriers to Discharge:  No Barriers Identified   Mathews Argyle, LCSW 12/15/2014, 3:31 PM

## 2014-12-15 NOTE — Clinical Social Work Placement (Signed)
   CLINICAL SOCIAL WORK PLACEMENT  NOTE  Date:  12/15/2014  Patient Details  Name: Gwendolyn Norman MRN: [id number] Date of Birth: 11-21-1931  Clinical Social Work is seeking post-discharge placement for this patient at the [place] level of care (*CSW will initial, date and re-position this form in  chart as items are completed):  Yes   Patient/family provided with [facility] Work Department's list of facilities offering this level of care within the geographic area requested by the patient (or if unable, by the patient's family).  Yes   Patient/family informed of their freedom to choose among providers that offer the needed level of care, that participate in Medicare, Medicaid or managed care program needed by the patient, have an available bed and are willing to accept the patient.  Yes   Patient/family informed of [facility]'s ownership interest in [facility] and [facility] [address] as well as of the fact that they are under no obligation to receive care at these facilities.  PASRR submitted to EDS on 12/15/14     PASRR number received on 12/15/14     Existing PASRR number confirmed on       FL2 transmitted to all facilities in geographic area requested by pt/family on 12/15/14     FL2 transmitted to all facilities within larger geographic area on       Patient informed that his/her managed care company has contracts with or will negotiate with certain facilities, including the following:            Patient/family informed of bed offers received.  Patient chooses bed at       Physician recommends and patient chooses bed at      Patient to be transferred to   on  .  Patient to be transferred to facility by       Patient family notified on   of transfer.  Name of family member notified:        PHYSICIAN Please sign FL2     Additional Comment:    _______________________________________________ Mathews Argyle,  LCSW 12/15/2014, 3:56 PM

## 2014-12-16 ENCOUNTER — Inpatient Hospital Stay: Payer: Medicare Other | Admitting: Anesthesiology

## 2014-12-16 ENCOUNTER — Encounter: Payer: Self-pay | Admitting: Anesthesiology

## 2014-12-16 ENCOUNTER — Encounter
Admission: EM | Disposition: A | Discharge status: Skilled Nursing Facility | Payer: Self-pay | Source: Home / Self Care | Attending: Internal Medicine

## 2014-12-16 HISTORY — PX: ESOPHAGOGASTRODUODENOSCOPY (EGD) WITH PROPOFOL: SHX5813

## 2014-12-16 LAB — BASIC METABOLIC PANEL
Anion gap: 4 — ABNORMAL LOW (ref 5–15)
BUN: 15 mg/dL (ref 6–20)
CO2: 32 mmol/L (ref 22–32)
Calcium: 7.1 mg/dL — ABNORMAL LOW (ref 8.9–10.3)
Chloride: 95 mmol/L — ABNORMAL LOW (ref 101–111)
Creatinine, Ser: 0.54 mg/dL (ref 0.44–1.00)
GFR calc Af Amer: >60 mL/min (ref >60)
GFR calc non Af Amer: >60 mL/min (ref >60)
Glucose, Bld: 82 mg/dL (ref 65–99)
Potassium: 3.5 mmol/L (ref 3.5–5.1)
Sodium: 131 mmol/L — ABNORMAL LOW (ref 135–145)

## 2014-12-16 LAB — CBC
HCT: 22.9 % — ABNORMAL LOW (ref 35.0–47.0)
Hemoglobin: 7.3 g/dL — ABNORMAL LOW (ref 12.0–16.0)
MCH: 26.7 pg (ref 26.0–34.0)
MCHC: 32.1 g/dL (ref 32.0–36.0)
MCV: 83 fL (ref 80.0–100.0)
Platelets: 231 10*3/uL (ref 150–440)
RBC: 2.75 MIL/uL — ABNORMAL LOW (ref 3.80–5.20)
RDW: 20.1 % — ABNORMAL HIGH (ref 11.5–14.5)
WBC: 5.5 10*3/uL (ref 3.6–11.0)

## 2014-12-16 LAB — PROTIME-INR
INR: 1.48
PROTHROMBIN TIME: 18.1 s — AB (ref 11.4–15.0)

## 2014-12-16 SURGERY — ESOPHAGOGASTRODUODENOSCOPY (EGD) WITH PROPOFOL
Anesthesia: General

## 2014-12-16 MED ORDER — FENTANYL CITRATE (PF) 100 MCG/2ML IJ SOLN: 25.0000 ug | INTRAMUSCULAR | Status: DC | PRN

## 2014-12-16 MED ORDER — SODIUM CHLORIDE 0.9 % IV SOLN
INTRAVENOUS | Status: DC
  Administered 2014-12-16: 11:00:00 via INTRAVENOUS

## 2014-12-16 MED ORDER — SODIUM CHLORIDE 0.9 % IV SOLN: INTRAVENOUS | Status: DC

## 2014-12-16 MED ORDER — EPHEDRINE SULFATE 50 MG/ML IJ SOLN
INTRAMUSCULAR | Status: DC | PRN
  Administered 2014-12-16: 5 mg via INTRAVENOUS

## 2014-12-16 MED ORDER — ONDANSETRON HCL 4 MG/2ML IJ SOLN: 4.0000 mg | Freq: Once | INTRAMUSCULAR | Status: DC | PRN

## 2014-12-16 MED ORDER — BISACODYL 5 MG PO TBEC
10.0000 mg | DELAYED_RELEASE_TABLET | Freq: Once | ORAL | Status: AC
  Administered 2014-12-16: 10 mg via ORAL
  Filled 2014-12-16 (×2): qty 2

## 2014-12-16 MED ORDER — LIDOCAINE HCL (CARDIAC) 20 MG/ML IV SOLN
INTRAVENOUS | Status: DC | PRN
  Administered 2014-12-16: 40 mg via INTRAVENOUS

## 2014-12-16 MED ORDER — CIPROFLOXACIN IN D5W 400 MG/200ML IV SOLN: 400.0000 mg | Freq: Once | INTRAVENOUS | Status: DC

## 2014-12-16 MED ORDER — PROPOFOL INFUSION 10 MG/ML OPTIME
INTRAVENOUS | Status: DC | PRN
  Administered 2014-12-16: 75 ug/kg/min via INTRAVENOUS

## 2014-12-16 MED ORDER — CIPROFLOXACIN IN D5W 400 MG/200ML IV SOLN
400.0000 mg | Freq: Once | INTRAVENOUS | Status: AC
  Administered 2014-12-16: 400 mg via INTRAVENOUS
  Filled 2014-12-16: qty 200

## 2014-12-16 MED ORDER — PEG 3350-KCL-NA BICARB-NACL 420 G PO SOLR
4000.0000 mL | Freq: Once | ORAL | Status: AC
  Administered 2014-12-16: 4000 mL via ORAL
  Filled 2014-12-16: qty 4000

## 2014-12-16 NOTE — Anesthesia Preprocedure Evaluation (Signed)
Anesthesia Evaluation  Patient identified by MRN, date of birth, ID band Patient awake    Reviewed: Allergy & Precautions, NPO status , Patient's Chart, lab work & pertinent test results, reviewed documented beta blocker date and time   Airway Mallampati: III  TM Distance: <3 FB Neck ROM: Limited    Dental  (+) Chipped   Pulmonary  Pulmonary HTN   Pulmonary exam normal breath sounds clear to auscultation       Cardiovascular hypertension, +CHF  + dysrhythmias Atrial Fibrillation  Rhythm:Irregular Rate:Normal  Rheumatic heart Dz... Hx of Mitral valve replacement   Neuro/Psych Parkinson's Dz CVA    GI/Hepatic Neg liver ROS, GERD  Medicated and Controlled,  Endo/Other  negative endocrine ROS  Renal/GU negative Renal ROS     Musculoskeletal  (+) Arthritis , Osteoarthritis,    Abdominal Normal abdominal exam  (+)   Peds negative pediatric ROS (+)  Hematology  (+) anemia ,   Anesthesia Other Findings   Reproductive/Obstetrics                             Anesthesia Physical Anesthesia Plan  ASA: III  Anesthesia Plan: General   Post-op Pain Management:    Induction: Intravenous  Airway Management Planned: Nasal Cannula  Additional Equipment:   Intra-op Plan:   Post-operative Plan:   Informed Consent: I have reviewed the patients History and Physical, chart, labs and discussed the procedure including the risks, benefits and alternatives for the proposed anesthesia with the patient or authorized representative who has indicated his/her understanding and acceptance.   Dental advisory given  Plan Discussed with: CRNA and Surgeon  Anesthesia Plan Comments:         Anesthesia Quick Evaluation

## 2014-12-16 NOTE — Op Note (Signed)
Reviewed old records. Refused endoscopies in the past. UGI done on 2/11 showed tertiary contractions of the esophagus only. DCBE done on 3/11 was completely normal. EGD today was normal. No bleeding source found. Will prep pt today for colonoscopy in the past.

## 2014-12-16 NOTE — Transfer of Care (Signed)
Immediate Anesthesia Transfer of Care Note  Patient: Gwendolyn Norman  Procedure(s) Performed: Procedure(s): ESOPHAGOGASTRODUODENOSCOPY (EGD) WITH PROPOFOL (N/A)  Patient Location: PACU and Endoscopy Unit  Anesthesia Type:General  Level of Consciousness: sedated  Airway & Oxygen Therapy: Patient Spontanous Breathing and Patient connected to nasal cannula oxygen  Post-op Assessment: Report given to RN and Post -op Vital signs reviewed and stable  Post vital signs: Reviewed and stable  Last Vitals:  Filed Vitals:   12/16/14 1128  BP: 124/64  Pulse: 87  Temp: 36.4 C  Resp: 17    Complications: No apparent anesthesia complications

## 2014-12-16 NOTE — Progress Notes (Signed)
[facility] NAME: Gwendolyn Norman    MR#:  [id number]  DATE OF BIRTH:  [birth year]  SUBJECTIVE:  CHIEF COMPLAINT:   Chief Complaint  Patient presents with  . Abnormal Lab   Nervous about EGD today. No further bleeding noted. No abdominal pain.  REVIEW OF SYSTEMS:   Review of Systems  Constitutional: Negative for fever.  Respiratory: Negative for shortness of breath.   Cardiovascular: Negative for chest pain and palpitations.  Gastrointestinal: Negative for nausea, vomiting and abdominal pain.  Genitourinary: Negative for dysuria.    DRUG ALLERGIES:   Allergies  Allergen Reactions  . Penicillins Shortness Of Breath and Other (See Comments)    Has patient had a PCN reaction causing immediate rash, facial/tongue/throat swelling, SOB or lightheadedness with hypotension: Yes Has patient had a PCN reaction causing severe rash involving mucus membranes or skin necrosis: No Has patient had a PCN reaction that required hospitalization No Has patient had a PCN reaction occurring within the last 10 years: No If all of the above answers are "NO", then may proceed with Cephalosporin use.   Marland Kitchen Doxycycline Rash    VITALS:  Blood pressure 109/52, pulse 99, temperature 97.9 F (36.6 C), temperature source Oral, resp. rate 18, height 5\' 7"  (1.702 m), weight 63.549 kg (140 lb 1.6 oz), SpO2 99 %.  PHYSICAL EXAMINATION:  GENERAL:  81 y.o.-year-old patient lying in the bed with no acute distress.  LUNGS: Normal breath sounds bilaterally, no wheezing, rales,rhonchi or crepitation. No use of accessory muscles of respiration.  CARDIOVASCULAR: S1, S2 normal. No murmurs, rubs, or gallops.  ABDOMEN: Soft, nontender, nondistended. Bowel sounds present. No organomegaly or mass. No guarding no rebound EXTREMITIES: No pedal edema, cyanosis, or clubbing.  NEUROLOGIC: Cranial nerves II through XII are intact. Muscle strength 5/5 in all  extremities. Sensation intact. Gait not checked.  PSYCHIATRIC: The patient is alert and oriented to person, poor historian SKIN: No obvious rash, lesion, or ulcer.    LABORATORY PANEL:   CBC  Recent Labs Lab 12/16/14 0838  WBC 5.5  HGB 7.3*  HCT 22.9*  PLT 231   ------------------------------------------------------------------------------------------------------------------  Chemistries   Recent Labs Lab 12/14/14 1225  12/16/14 0838  NA 130*  < > 131*  K 3.1*  < > 3.5  CL 93*  < > 95*  CO2 29  < > 32  GLUCOSE 97  < > 82  BUN 18  < > 15  CREATININE 0.59  < > 0.54  CALCIUM 6.8*  < > 7.1*  AST 18  --   --   ALT <5*  --   --   ALKPHOS 105  --   --   BILITOT 0.6  --   --   < > = values in this interval not displayed. ------------------------------------------------------------------------------------------------------------------  Cardiac Enzymes  Recent Labs Lab 12/14/14 1657  TROPONINI 0.05*   ------------------------------------------------------------------------------------------------------------------  RADIOLOGY:  Dg Chest 2 View  12/14/2014   CLINICAL DATA:  81 year old female with 1 year history of cough.  EXAM: CHEST  2 VIEW  COMPARISON:  Prior chest x-ray 12/08/2014  FINDINGS: Unchanged massive enlargement of the cardiopericardial silhouette. Patient is status post median sternotomy. Atherosclerotic calcification present within the transverse aorta. No evidence of pulmonary edema. Small right pleural effusion with associated right basilar atelectasis. No focal scratch then no acute osseous abnormality.  IMPRESSION: 1. Stable chest x-ray with persistent massive enlargement of the cardiopericardial silhouette which may reflect cardiomegaly  and/or pericardial effusion. 2. Small right pleural effusion with associated right lower lobe atelectasis. 3. Negative for pulmonary edema.   Electronically Signed   By: Jacqulynn Cadet M.D.   On: 12/14/2014 20:09   US  Venous Img Lower Bilateral  12/14/2014   CLINICAL DATA:  Chronic bilateral lower extremity pain and swelling, discoloration.  EXAM: BILATERAL LOWER EXTREMITY VENOUS DOPPLER ULTRASOUND  TECHNIQUE: Gray-scale sonography with graded compression, as well as color Doppler and duplex ultrasound were performed to evaluate the lower extremity deep venous systems from the level of the common femoral vein and including the common femoral, femoral, profunda femoral, popliteal and calf veins including the posterior tibial, peroneal and gastrocnemius veins when visible. The superficial great saphenous vein was also interrogated. Spectral Doppler was utilized to evaluate flow at rest and with distal augmentation maneuvers in the common femoral, femoral and popliteal veins.  COMPARISON:  None.  FINDINGS: RIGHT LOWER EXTREMITY  Common Femoral Vein: No evidence of thrombus. Normal compressibility, respiratory phasicity and response to augmentation.  Saphenofemoral Junction: No evidence of thrombus. Normal compressibility and flow on color Doppler imaging.  Profunda Femoral Vein: No evidence of thrombus. Normal compressibility and flow on color Doppler imaging.  Femoral Vein: No evidence of thrombus. Normal compressibility, respiratory phasicity and response to augmentation.  Popliteal Vein: No evidence of thrombus. Normal compressibility, respiratory phasicity and response to augmentation.  Calf Veins: No evidence of thrombus. Normal compressibility and flow on color Doppler imaging.  Superficial Great Saphenous Vein: No evidence of thrombus. Normal compressibility and flow on color Doppler imaging.  Venous Reflux:  None.  Other Findings: Right lower extremity subcutaneous peripheral edema noted. Right inguinal prominent lymph node measures 18 x 13 x 7 mm.  LEFT LOWER EXTREMITY  Common Femoral Vein: No evidence of thrombus. Normal compressibility, respiratory phasicity and response to augmentation.  Saphenofemoral Junction: No  evidence of thrombus. Normal compressibility and flow on color Doppler imaging.  Profunda Femoral Vein: No evidence of thrombus. Normal compressibility and flow on color Doppler imaging.  Femoral Vein: No evidence of thrombus. Normal compressibility, respiratory phasicity and response to augmentation.  Popliteal Vein: No evidence of thrombus. Normal compressibility, respiratory phasicity and response to augmentation.  Calf Veins: No evidence of thrombus. Normal compressibility and flow on color Doppler imaging.  Superficial Great Saphenous Vein: No evidence of thrombus. Normal compressibility and flow on color Doppler imaging.  Venous Reflux:  None.  Other Findings: Similar left lower extremity subcutaneous edema noted.  IMPRESSION: Negative for significant DVT in either extremity.  Peripheral subcutaneous edema  Borderline right inguinal adenopathy   Electronically Signed   By: Jerilynn Mages.  Shick M.D.   On: 12/14/2014 16:46    EKG:   Orders placed or performed during the hospital encounter of 12/14/14  . EKG 12-Lead  . EKG 12-Lead  . EKG 12-Lead  . EKG 12-Lead  . EKG 12-Lead  . EKG 12-Lead    ASSESSMENT AND PLAN:   Acute on chronic anemia - Heme positive stool, dark stool, is on iron supplementation making this difficult to assess - Appreciate GI consultation and planned for EGD today - Anemia panel not revealing - Given that she is a jehovah's witness and will not receive blood products I think it would be important to determine if there is a source of bleeding is so that we could intervene. Discussed this with the patient and her daughter today. Also as she has a mechanical valve and is usually anticoagulated this is our window of  opportunity for the studies. - INR is normal at this time - Continue Protonix  Urinary tract infection - Treated, UA is negative  Mechanical mitral valve - Appreciate cardiology consultation - INR is back to normal, will hold warfarin at this time in anticipation of  procedure in the morning - Resume Coumadin after procedure  Parkinson's - Continue Sinemet  Hypertension - Blood pressure low normal - Hold amlodipine  CODE STATUS: Full  TOTAL TIME TAKING CARE OF THIS PATIENT: 35 minutes.  Greater than 50% of time spent in care coordination and counseling. POSSIBLE D/C IN 1-2 DAYS, DEPENDING ON CLINICAL CONDITION.   Myrtis Ser M.D on 12/16/2014 at 2:11 PM  Between 7am to 6pm - Pager - [phone]  After 6pm go to [email or web address] - password [email or web address] [facility]  [facility]  Office  [phone]  CC: Primary care physician; Sherrin Daisy, MD

## 2014-12-16 NOTE — Evaluation (Signed)
Physical Therapy Evaluation Patient Details Name: MAUREENA DABBS MRN: [id number] DOB: 06-19-1931 Today's Date: 12/16/2014   History of Present Illness  Patient is a pleasant 81 y/o female that presents with possible GI complications and has been noted to have low hemoglobin since this admission. She was recently diagnosed with Parkinson's and has been participating with HHPT recently.   Clinical Impression  Patient is a very pleasant 81 y/o female that has recently been diagnosed with Parkinson's and has had a progressive decline in mobility recently. Per her daughter patient has fallen 12x this year, though no major injuries have been sustained at this time. Patient requires significant assistance with bed mobility and transfers at this time (mod A for bed mobility, mod-max A for sit to stand) secondary to generalized weakness and deconditioning. Patient does complain of R ankle pain, it is noted to be inverted throughout dependent dangling, but is able to support her bodyweight with no increase in pain. Upon palpation, she is tender over invertor muscle tendons though she maintains appropriate DF/PF strength. She is able to ambulate 35 feet with RW and significant cuing for increased step height and length at a very slow pace and is at very high risk of falling in the above context. Skilled acute PT services are indicated to address her mobility deficits.     Follow Up Recommendations SNF    Equipment Recommendations       Recommendations for Other Services       Precautions / Restrictions Precautions Precautions: Fall Restrictions Weight Bearing Restrictions: No      Mobility  Bed Mobility Overal bed mobility: Needs Assistance Bed Mobility: Supine to Sit     Supine to sit: Mod assist     General bed mobility comments: Patient is able to bring her legs over the edge of the bed, however she requires mod A x 1 to elevate her trunk secondary to generalized weakness. Patient  responds well to cuing and uses hand rails approrpriately.   Transfers Overall transfer level: Needs assistance Equipment used: Rolling walker (2 wheeled) Transfers: Sit to/from Stand Sit to Stand: Mod assist;Max assist         General transfer comment: Patient requires significant physical assistance and cuing to come upright and cries out in strain as she is very weak and deconditioned.   Ambulation/Gait Ambulation/Gait assistance: Min guard Ambulation Distance (Feet): 35 Feet Assistive device: Rolling walker (2 wheeled) Gait Pattern/deviations: Decreased step length - right;Decreased step length - left   Gait velocity interpretation: <1.8 ft/sec, indicative of risk for recurrent falls General Gait Details: Patient shuffles in gait occassionally, however she requires significant cuing to "lift her foot over the shoebox" to increase her floor clearance and step length. She has fallen x 12 this year, she is able to provide better effort with gait, but given her significantly decreased gait speed and poor clearance she fatigues quickly.   Stairs            Wheelchair Mobility    Modified Rankin (Stroke Patients Only)       Balance Overall balance assessment: Needs assistance Sitting-balance support: Feet supported Sitting balance-Leahy Scale: Fair Sitting balance - Comments: Patient is able to participate in MMT without loss of balance.    Standing balance support: Bilateral upper extremity supported Standing balance-Leahy Scale: Poor Standing balance comment: Patient does not lose her balance with RW during ambulation throughout this session, she does require significant cuing for RW placement for turns and assistance for sit  to stand.                              Pertinent Vitals/Pain Pain Assessment: No/denies pain    Home Living Family/patient expects to be discharged to:: [place]: Children;Other relatives                Additional Comments: Patient lives with her daughter, both expect her to require a rehab stay.     Prior Function Level of Independence: Needs assistance   Gait / Transfers Assistance Needed: Patient requires physical assistance with gait/transfers/bed mobility from daughter.   ADL's / Homemaking Assistance Needed: Patient's daughter completes many ADLs and homemaking chores.   Comments: Per daughter she is becoming very fatigued with the physical demands of caring for her mother.      Hand Dominance        Extremity/Trunk Assessment   Upper Extremity Assessment: Overall WFL for tasks assessed           Lower Extremity Assessment: Overall WFL for tasks assessed (Patient able to hold break tests.)         Communication   Communication: HOH  Cognition Arousal/Alertness: Awake/alert Behavior During Therapy: WFL for tasks assessed/performed Overall Cognitive Status: Within Functional Limits for tasks assessed                      General Comments      Exercises General Exercises - Lower Extremity Long Arc Quad: AROM;Both;10 reps      Assessment/Plan    PT Assessment Patient needs continued PT services  PT Diagnosis Difficulty walking;Generalized weakness   PT Problem List Decreased strength;Decreased activity tolerance;Decreased safety awareness;Decreased balance;Decreased mobility  PT Treatment Interventions DME instruction;Gait training;Therapeutic activities;Therapeutic exercise;Balance training;Neuromuscular re-education   PT Goals (Current goals can be found in the Care Plan section) Acute Rehab PT Goals Patient Stated Goal: To increase her functional independence  PT Goal Formulation: With patient/family Time For Goal Achievement: 12/30/14 Potential to Achieve Goals: Good    Frequency Min 2X/week   Barriers to discharge Decreased caregiver support Daughter is becoming increasingly unable to provide for physical assistance with  continued deterioration of patient.     Co-evaluation               End of Session Equipment Utilized During Treatment: Gait belt Activity Tolerance: Patient tolerated treatment well Patient left: in chair;with call bell/phone within reach;with chair alarm set;with family/visitor present Nurse Communication: Mobility status         Time: 8811-0315 PT Time Calculation (min) (ACUTE ONLY): 27 min   Charges:   PT Evaluation $Initial PT Evaluation Tier I: 1 Procedure     PT G Codes:       Kerman Passey, PT, DPT    12/16/2014, 3:03 PM

## 2014-12-16 NOTE — Op Note (Signed)
[facility] Patient Name: Gwendolyn Norman Procedure Date: 12/16/2014 11:41 AM MRN: [id number] Account #: [id number] Date of Birth: [birth year] Admit Type: Outpatient Age: 81 Room: [facility] ENDO ROOM 4 Gender: Female Note Status: Finalized Procedure:         Upper GI endoscopy Indications:       Acute post hemorrhagic anemia, Melena Providers:         Lupita Dawn. Candace Cruise, MD Referring MD:      Forest Gleason Md, MD (Referring MD) Medicines:         Monitored Anesthesia Care Complications:     No immediate complications. Procedure:         Pre-Anesthesia Assessment:                    - Prior to the procedure, a History and Physical was                     performed, and patient medications, allergies and                     sensitivities were reviewed. The patient's tolerance of                     previous anesthesia was reviewed.                    - The risks and benefits of the procedure and the sedation                     options and risks were discussed with the patient. All                     questions were answered and informed consent was obtained.                    - After reviewing the risks and benefits, the patient was                     deemed in satisfactory condition to undergo the procedure.                    After obtaining informed consent, the endoscope was passed                     under direct vision. Throughout the procedure, the                     patient's blood pressure, pulse, and oxygen saturations                     were monitored continuously. The Endoscope was introduced                     through the mouth, and advanced to the second part of                     duodenum. The upper GI endoscopy was accomplished without                     difficulty. The patient tolerated the procedure well. Findings:      The examined esophagus was normal.      A small amount of food (residue) was found in the gastric body.      Previous  pyloroplasty?  The exam was otherwise without abnormality.      The examined duodenum was normal. Impression:        - Normal esophagus.                    - A small amount of food (residue) in the stomach.                    - The examination was otherwise normal.                    - Normal examined duodenum.                    - No specimens collected. Recommendation:    - Observe patient's clinical course.                    - The findings and recommendations were discussed with the                     patient.                    - Bowel prep for colonoscopy tomorrow. Procedure Code(s): --- Professional ---                    [phone], Esophagogastroduodenoscopy, flexible, transoral;                     diagnostic, including collection of specimen(s) by                     brushing or washing, when performed (separate procedure) Diagnosis Code(s): --- Professional ---                    D62, Acute posthemorrhagic anemia                    K92.1, Melena CPT copyright 2014 American Medical Association. All rights reserved. The codes documented in this report are preliminary and upon coder review may  be revised to meet current compliance requirements. Hulen Luster, MD 12/16/2014 12:10:03 PM This report has been signed electronically. Number of Addenda: 0 Note Initiated On: 12/16/2014 11:41 AM      [facility]

## 2014-12-16 NOTE — Progress Notes (Signed)
Alert and oriented, somewhat hard of hearing. EGD performed today, negative. Patient is to have colonoscopy tomorrow. Consent is signed and in the chart, patient educated. Patient is currently drinking co-lyte for test. Afib on tele, rate controlled. No complaints of pain. Will continue to monitor.

## 2014-12-16 NOTE — Progress Notes (Signed)
Per Dr. Candace Cruise okay to give patient clear liquid diet. Pharmacist stated that solution for colonoscopy has to be chilled for almost 2 hours before giving, will reschedule for this afternoon. Dr. Candace Cruise stated that was fine.

## 2014-12-17 ENCOUNTER — Observation Stay: Payer: Medicare Other | Admitting: Anesthesiology

## 2014-12-17 ENCOUNTER — Observation Stay: Payer: Medicare Other

## 2014-12-17 ENCOUNTER — Encounter: Payer: Self-pay | Admitting: Anesthesiology

## 2014-12-17 ENCOUNTER — Encounter
Admission: EM | Disposition: A | Discharge status: Skilled Nursing Facility | Payer: Self-pay | Source: Home / Self Care | Attending: Internal Medicine

## 2014-12-17 HISTORY — PX: COLONOSCOPY: SHX5424

## 2014-12-17 LAB — URINE CULTURE

## 2014-12-17 SURGERY — COLONOSCOPY
Anesthesia: General

## 2014-12-17 MED ORDER — PROPOFOL 10 MG/ML IV BOLUS
INTRAVENOUS | Status: DC | PRN
  Administered 2014-12-17: 20 mg via INTRAVENOUS
  Administered 2014-12-17 (×2): 30 mg via INTRAVENOUS

## 2014-12-17 MED ORDER — LEVOFLOXACIN 250 MG PO TABS
250.0000 mg | ORAL_TABLET | Freq: Every day | ORAL | Status: DC
  Administered 2014-12-17 – 2014-12-20 (×4): 250 mg via ORAL
  Filled 2014-12-17 (×4): qty 1

## 2014-12-17 MED ORDER — IOHEXOL 240 MG/ML SOLN
25.0000 mL | INTRAMUSCULAR | Status: AC
  Administered 2014-12-17 (×2): 25 mL via ORAL

## 2014-12-17 MED ORDER — PHENYLEPHRINE HCL 10 MG/ML IJ SOLN
INTRAMUSCULAR | Status: DC | PRN
  Administered 2014-12-17: 100 ug via INTRAVENOUS

## 2014-12-17 MED ORDER — PROPOFOL 500 MG/50ML IV EMUL
INTRAVENOUS | Status: DC | PRN
  Administered 2014-12-17: 60 ug/kg/min via INTRAVENOUS

## 2014-12-17 MED ORDER — IOHEXOL 300 MG/ML  SOLN
80.0000 mL | Freq: Once | INTRAMUSCULAR | Status: AC | PRN
  Administered 2014-12-17: 80 mL via INTRAVENOUS

## 2014-12-17 MED ORDER — LIDOCAINE HCL (CARDIAC) 20 MG/ML IV SOLN
INTRAVENOUS | Status: DC | PRN
  Administered 2014-12-17: 60 mg via INTRAVENOUS

## 2014-12-17 MED ORDER — SODIUM CHLORIDE 0.9 % IV SOLN
INTRAVENOUS | Status: DC
  Administered 2014-12-17: 11:00:00 via INTRAVENOUS

## 2014-12-17 MED ORDER — CIPROFLOXACIN IN D5W 400 MG/200ML IV SOLN
400.0000 mg | Freq: Once | INTRAVENOUS | Status: AC
  Administered 2014-12-17: 400 mg via INTRAVENOUS
  Filled 2014-12-17: qty 200

## 2014-12-17 NOTE — Op Note (Signed)
Large mass near hepatic flexure? Unable to get scope beyond this site. Bx's taken. Recommend CT to localize mass/check for mets. Surgical referral since mass is nearly obstructing. Will be problematic since pt refuses blood transfusions. Thanks

## 2014-12-17 NOTE — Care Management (Signed)
Patient's discharge has been cancelled.  A large intestinal mass was found during colonoscopy.  Options are being discussed.  Surgical consult is in progress.  Abd CT is pending in hopes of determining if the mass is confined to the intestine.

## 2014-12-17 NOTE — Anesthesia Preprocedure Evaluation (Signed)
Anesthesia Evaluation  Patient identified by MRN, date of birth, ID band Patient awake    Reviewed: Allergy & Precautions, H&P , NPO status , Patient's Chart, lab work & pertinent test results, reviewed documented beta blocker date and time   Airway Mallampati: III  TM Distance: <3 FB Neck ROM: Limited    Dental no notable dental hx. (+) Chipped   Pulmonary neg pulmonary ROS,  Pulmonary HTN   Pulmonary exam normal breath sounds clear to auscultation       Cardiovascular Exercise Tolerance: Poor hypertension, +CHF  Normal cardiovascular exam+ dysrhythmias Atrial Fibrillation  Rhythm:Irregular Rate:Normal  Rheumatic heart Dz... Hx of Mitral valve replacement   Neuro/Psych Parkinson's Dz CVA negative psych ROS   GI/Hepatic Neg liver ROS, GERD  Medicated and Controlled,  Endo/Other  negative endocrine ROS  Renal/GU negative Renal ROS  negative genitourinary   Musculoskeletal  (+) Arthritis , Osteoarthritis,    Abdominal Normal abdominal exam  (+)   Peds negative pediatric ROS (+)  Hematology negative hematology ROS (+) anemia ,   Anesthesia Other Findings Past Medical History:   CHF (congestive heart failure)                                 Comment:diastolic dysfunction   Parkinson disease                                            Hypertension                                                 Osteoarthritis                                               Rheumatic heart disease                                      H/O mitral valve replacement with mechanical v*              GERD (gastroesophageal reflux disease)                       CVA (cerebral infarction)                                      Comment:No residual neuro deficits   Atrial fibrillation                                          Pulmonary hypertension                                       Reproductive/Obstetrics negative OB ROS  Anesthesia Physical  Anesthesia Plan  ASA: III  Anesthesia Plan: General   Post-op Pain Management:    Induction: Intravenous  Airway Management Planned: Nasal Cannula  Additional Equipment:   Intra-op Plan:   Post-operative Plan:   Informed Consent: I have reviewed the patients History and Physical, chart, labs and discussed the procedure including the risks, benefits and alternatives for the proposed anesthesia with the patient or authorized representative who has indicated his/her understanding and acceptance.   Dental advisory given  Plan Discussed with: CRNA and Surgeon  Anesthesia Plan Comments:         Anesthesia Quick Evaluation

## 2014-12-17 NOTE — Transfer of Care (Signed)
Immediate Anesthesia Transfer of Care Note  Patient: Gwendolyn Norman  Procedure(s) Performed: Procedure(s): COLONOSCOPY (N/A)  Patient Location: PACU  Anesthesia Type:MAC  Level of Consciousness: sedated  Airway & Oxygen Therapy: Patient Spontanous Breathing  Post-op Assessment: Report given to RN  Post vital signs: stable  Last Vitals:  Filed Vitals:   12/17/14 1006  BP: 121/66  Pulse: 76  Temp: 36.6 C  Resp: 18    Complications: No apparent anesthesia complications

## 2014-12-17 NOTE — Progress Notes (Signed)
Visited with the patient and her daughter to discuss colonoscopy results showing large intestinal mass. Discussed need for further imaging to define the extent of involvement. Discussed that this is likely a malignancy. If the malignancy extends into the abdomen and appears metastatic we would need to discuss options such as chemotherapy and palliation. However, if the mass is confined to the intestine surgery would be an option. The patient and her daughter will discuss. Timing is important in this case because she is currently not anticoagulated for her aortic valve and she is prepped for colonoscopy. This would be an opportune time for surgery. I have consulted surgical service and ordered a CT scan of the abdomen.

## 2014-12-17 NOTE — Anesthesia Postprocedure Evaluation (Signed)
  Anesthesia Post-op Note  Patient: Gwendolyn Norman  Procedure(s) Performed: Procedure(s): COLONOSCOPY (N/A)  Anesthesia type:General  Patient location: PACU  Post pain: Pain level controlled  Post assessment: Post-op Vital signs reviewed, Patient's Cardiovascular Status Stable, Respiratory Function Stable, Patent Airway and No signs of Nausea or vomiting  Post vital signs: Reviewed and stable  Last Vitals:  Filed Vitals:   12/17/14 1307  BP: 103/58  Pulse: 76  Temp: 36.4 C  Resp: 18    Level of consciousness: awake, alert  and patient cooperative  Complications: No apparent anesthesia complications

## 2014-12-17 NOTE — Op Note (Signed)
[facility] Patient Name: Zhoey Blackstock Procedure Date: 12/17/2014 11:12 AM MRN: [id number] Account #: [id number] Date of Birth: 13-Mar-1932 Admit Type: Inpatient Age: 81 Room: [facility] ENDO ROOM 4 Gender: Female Note Status: Finalized Procedure:         Colonoscopy Indications:       Melena, Iron deficiency anemia secondary to chronic blood                     loss Providers:         Lupita Dawn. Candace Cruise, MD Referring MD:      Shirline Frees (Referring MD) Medicines:         Monitored Anesthesia Care Complications:     No immediate complications. Procedure:         Pre-Anesthesia Assessment:                    - Prior to the procedure, a History and Physical was                     performed, and patient medications, allergies and                     sensitivities were reviewed. The patient's tolerance of                     previous anesthesia was reviewed.                    - The risks and benefits of the procedure and the sedation                     options and risks were discussed with the patient. All                     questions were answered and informed consent was obtained.                    - After reviewing the risks and benefits, the patient was                     deemed in satisfactory condition to undergo the procedure.                    After obtaining informed consent, the colonoscope was                     passed under direct vision. Throughout the procedure, the                     patient's blood pressure, pulse, and oxygen saturations                     were monitored continuously. The Colonoscope was                     introduced through the anus with the intention of                     advancing to the cecum. The scope was advanced to the                     hepatic flexure before the procedure was aborted.  Medications were given. The colonoscopy was performed                     without difficulty. The  patient tolerated the procedure                     well. The quality of the bowel preparation was fair. Findings:      A frond-like/villous partially obstructing large mass was found near the       hepatic flexure? The mass was circumferential. No bleeding was present.       Biopsies were taken with a cold forceps for histology. Unable to get       scope beyond this lesion Impression:        - Likely malignant partially obstructing tumor at the                     hepatic flexure. Biopsied. Biopsied. Recommendation:    - Await pathology results.                    - The findings and recommendations were discussed with the                     patient.                    - CT of abdomen to localize mass and to check for                     metastasis. Surgical referral. Procedure Code(s): --- Professional ---                    [phone], 52, Colonoscopy, flexible; with biopsy, single or                     multiple Diagnosis Code(s): --- Professional ---                    D49.0, Neoplasm of unspecified behavior of digestive system                    K92.1, Melena                    D50.0, Iron deficiency anemia secondary to blood loss                     (chronic) CPT copyright 2014 American Medical Association. All rights reserved. The codes documented in this report are preliminary and upon coder review may  be revised to meet current compliance requirements. Hulen Luster, MD 12/17/2014 11:51:41 AM This report has been signed electronically. Number of Addenda: 0 Note Initiated On: 12/17/2014 11:12 AM      [facility]

## 2014-12-17 NOTE — Progress Notes (Signed)
[place]  SUBJECTIVE: afib with good rate control. For colonoscopy today. No further bleeding   Filed Vitals:   12/16/14 1300 12/16/14 1335 12/16/14 1957 12/17/14 0523  BP: 109/56 109/52 114/68 104/52  Pulse: 84 99 79 69  Temp:  97.9 F (36.6 C) 98 F (36.7 C) 97.8 F (36.6 C)  TempSrc:  Oral Oral Oral  Resp:  18 16 16   Height:      Weight:      SpO2: 96% 99% 98% 100%    Intake/Output Summary (Last 24 hours) at 12/17/14 0811 Last data filed at 12/16/14 1700  Gross per 24 hour  Intake    230 ml  Output    500 ml  Net   -270 ml    LABS: Basic Metabolic Panel:  Recent Labs  12/15/14 0647 12/16/14 0838  NA 131* 131*  K 3.3* 3.5  CL 95* 95*  CO2 30 32  GLUCOSE 76 82  BUN 14 15  CREATININE 0.51 0.54  CALCIUM 7.0* 7.1*   Liver Function Tests:  Recent Labs  12/14/14 1225  AST 18  ALT <5*  ALKPHOS 105  BILITOT 0.6  PROT 5.1*  ALBUMIN 1.2*   No results for input(s): LIPASE, AMYLASE in the last 72 hours. CBC:  Recent Labs  12/15/14 0647 12/15/14 1434 12/16/14 0838  WBC 7.6  --  5.5  HGB 7.5* 7.2* 7.3*  HCT 22.2*  --  22.9*  MCV 83.7  --  83.0  PLT 217  --  231   Cardiac Enzymes:  Recent Labs  12/14/14 1657  TROPONINI 0.05*   BNP: Invalid input(s): POCBNP D-Dimer: No results for input(s): DDIMER in the last 72 hours. Hemoglobin A1C: No results for input(s): HGBA1C in the last 72 hours. Fasting Lipid Panel: No results for input(s): CHOL, HDL, LDLCALC, TRIG, CHOLHDL, LDLDIRECT in the last 72 hours. Thyroid Function Tests: No results for input(s): TSH, T4TOTAL, T3FREE, THYROIDAB in the last 72 hours.  Invalid input(s): FREET3 Anemia Panel:  Recent Labs  12/14/14 1225 12/14/14 2312  VITAMINB12 1360*  --   FOLATE  --  21.7  FERRITIN  --  123  TIBC  --  117*  IRON  --  55  RETICCTPCT  --  1.3     Physical Exam: Blood pressure 104/52, pulse 69, temperature 97.8 F (36.6 C), temperature  source Oral, resp. rate 16, height 5\' 7"  (1.702 m), weight 63.549 kg (140 lb 1.6 oz), SpO2 100 %. General appearance: cooperative Neck: no adenopathy, no carotid bruit, no JVD, supple, symmetrical, trachea midline and thyroid not enlarged, symmetric, no tenderness/mass/nodules Resp: clear to auscultation bilaterally Cardio: irregularly irregular rhythm GI: soft, non-tender; bowel sounds normal; no masses,  no organomegaly Neurologic: Grossly normal  TELEMETRY: Reviewed telemetry pt in afib with controlled vr:  ASSESSMENT AND PLAN:  Active Problems:   Anemia-ugi negative. For colonoscopy today.    Pressure ulcer  Mitral valve disease-s/p mvr with st jude mechanical valve. Off coumadin for anemia and dark stools. On iron supplement. Would resume warfarin after colonoscopy and bridge with lovenox 1 mg/kg bid until therapeutic. INR goal of 2.5-3.5.   afib-rate is controlled. On warfarin for valve. Resume coumadin as per above.   Dr. Nehemiah Massed on call for [facility] this weekend for any further questions.   Teodoro Spray., MD, [facility] 12/17/2014 8:11 AM

## 2014-12-17 NOTE — Progress Notes (Signed)
[place] [facility] [place] NAME: Gwendolyn Norman    MR#:  [id number]  DATE OF BIRTH:  11/22/1931  SUBJECTIVE:  CHIEF COMPLAINT:   Chief Complaint  Patient presents with  . Abnormal Lab   Seen prior to colonoscopy.  Comfortable and calm.  REVIEW OF SYSTEMS:   Review of Systems  Constitutional: Negative for fever.  Respiratory: Negative for shortness of breath.   Cardiovascular: Negative for chest pain and palpitations.  Gastrointestinal: Negative for nausea, vomiting and abdominal pain.  Genitourinary: Negative for dysuria.    DRUG ALLERGIES:   Allergies  Allergen Reactions  . Penicillins Shortness Of Breath and Other (See Comments)    Has patient had a PCN reaction causing immediate rash, facial/tongue/throat swelling, SOB or lightheadedness with hypotension: Yes Has patient had a PCN reaction causing severe rash involving mucus membranes or skin necrosis: No Has patient had a PCN reaction that required hospitalization No Has patient had a PCN reaction occurring within the last 10 years: No If all of the above answers are "NO", then may proceed with Cephalosporin use.   Marland Kitchen Doxycycline Rash    VITALS:  Blood pressure 103/58, pulse 76, temperature 97.5 F (36.4 C), temperature source Oral, resp. rate 18, height 5\' 7"  (1.702 m), weight 63.549 kg (140 lb 1.6 oz), SpO2 96 %.  PHYSICAL EXAMINATION:  GENERAL:  81 y.o.-year-old patient lying in the bed with no acute distress.  LUNGS: Normal breath sounds bilaterally, no wheezing, rales,rhonchi or crepitation. No use of accessory muscles of respiration.  CARDIOVASCULAR: S1, S2 normal. No murmurs, rubs, or gallops.  ABDOMEN: Soft, nontender, nondistended. Bowel sounds present. No organomegaly or mass. No guarding no rebound EXTREMITIES: No pedal edema, cyanosis, or clubbing.  NEUROLOGIC: Cranial nerves II through XII are intact. Muscle strength 5/5 in all extremities. Sensation  intact. Gait not checked.  PSYCHIATRIC: The patient is alert and oriented to person, poor historian SKIN: No obvious rash, lesion, or ulcer.    LABORATORY PANEL:   CBC  Recent Labs Lab 12/16/14 0838  WBC 5.5  HGB 7.3*  HCT 22.9*  PLT 231   ------------------------------------------------------------------------------------------------------------------  Chemistries   Recent Labs Lab 12/14/14 1225  12/16/14 0838  NA 130*  < > 131*  K 3.1*  < > 3.5  CL 93*  < > 95*  CO2 29  < > 32  GLUCOSE 97  < > 82  BUN 18  < > 15  CREATININE 0.59  < > 0.54  CALCIUM 6.8*  < > 7.1*  AST 18  --   --   ALT <5*  --   --   ALKPHOS 105  --   --   BILITOT 0.6  --   --   < > = values in this interval not displayed. ------------------------------------------------------------------------------------------------------------------  Cardiac Enzymes  Recent Labs Lab 12/14/14 1657  TROPONINI 0.05*   ------------------------------------------------------------------------------------------------------------------  RADIOLOGY:  No results found.  EKG:   Orders placed or performed during the hospital encounter of 12/14/14  . EKG 12-Lead  . EKG 12-Lead  . EKG 12-Lead  . EKG 12-Lead  . EKG 12-Lead  . EKG 12-Lead    ASSESSMENT AND PLAN:   Acute on chronic anemia - Heme positive stool, dark stool, is on iron supplementation making this difficult to assess - Appreciate GI consultation: EGD yesterday normal, colonoscopy today - Anemia panel not revealing - Given that she is a jehovah's witness and will not receive blood products I  think it would be important to determine if there is a source of bleeding is so that we could intervene. Discussed this with the patient and her daughter today. Also as she has a mechanical valve and is usually anticoagulated this is our window of opportunity for the studies. - INR is normal at this time - Continue Protonix  Urinary tract infection -  Treated, UA is negative  Mechanical mitral valve - Appreciate cardiology consultation - INR is back to normal, will hold warfarin at this time in anticipation of procedure  - Resume Coumadin after procedure  Parkinson's - Continue Sinemet  Hypertension - Blood pressure low normal - Hold amlodipine  CODE STATUS: Full  TOTAL TIME TAKING CARE OF THIS PATIENT: 35 minutes.  Greater than 50% of time spent in care coordination and counseling. POSSIBLE D/C IN 1-2 DAYS, DEPENDING ON CLINICAL CONDITION.   Myrtis Ser M.D on 12/17/2014 at 4:39 PM  Between 7am to 6pm - Pager - [phone]  After 6pm go to [email or web address] - password [email or web address] [facility]  [facility]  Office  [phone]  CC: Primary care physician; Sherrin Daisy, MD

## 2014-12-17 NOTE — Clinical Social Work Placement (Signed)
   CLINICAL SOCIAL WORK PLACEMENT  NOTE  Date:  12/17/2014  Patient Details  Name: Gwendolyn Norman MRN: [id number] Date of Birth: 1931/06/15  Clinical Social Work is seeking post-discharge placement for this patient at the [place] level of care (*CSW will initial, date and re-position this form in  chart as items are completed):  Yes   Patient/family provided with [facility] Work Department's list of facilities offering this level of care within the geographic area requested by the patient (or if unable, by the patient's family).  Yes   Patient/family informed of their freedom to choose among providers that offer the needed level of care, that participate in Medicare, Medicaid or managed care program needed by the patient, have an available bed and are willing to accept the patient.  Yes   Patient/family informed of [facility]'s ownership interest in [facility] and [facility], as well as of the fact that they are under no obligation to receive care at these facilities.  PASRR submitted to EDS on 12/15/14     PASRR number received on 12/15/14     Existing PASRR number confirmed on       FL2 transmitted to all facilities in geographic area requested by pt/family on 12/15/14     FL2 transmitted to all facilities within larger geographic area on       Patient informed that his/her managed care company has contracts with or will negotiate with certain facilities, including the following:            Patient/family informed of bed offers received.  Patient chooses bed at  [facility])     Physician recommends and patient chooses bed at      Patient to be transferred to  [facility]) on  .  Patient to be transferred to facility by       Patient family notified on   of transfer.  Name of family member notified:        PHYSICIAN Please sign FL2     Additional Comment:     _______________________________________________ Mathews Argyle, LCSW 12/17/2014, 8:39 AM

## 2014-12-17 NOTE — Progress Notes (Signed)
Spoke with Jonelle Sidle, [employer] rep at [phone], to notify of non-emergent EMS transport.  Auth notification reference given as [id number].   Service date range good from 12/17/14 - 03/17/15.   Gap exception requested to determine if services can be considered at an in-network level.

## 2014-12-17 NOTE — Consult Note (Signed)
Patient ID: Gwendolyn Norman, female   DOB: 1931-09-25, 81 y.o.   MRN: [id number] CC: Chronic Anemia HPI Gwendolyn Norman is a 81 y.o. female admitted to the medicine service for workup of her chronic anemia. Patient has noted a Jehovah's Witness and on chronic anticoagulation for mechanical heart valve. She has had chronic weakness and documented anemia over quite some time. During her hospitalization she had her anticoagulation reversed and she underwent an EGD and colonoscopy which showed a large nearly obstructing colon mass thought to be near the hepatic flexure. Surgery consultation asked for discussion with the patient about her surgical options. Patient denies any current fevers, chills, nausea, vomiting, diarrhea, constipation. She does state she's had a chronic right-sided abdominal pain and wonders if this could be related. She is otherwise in her usual state of health all of her other chronic medical problems. There is no family history of colon cancer.  HPI  Past Medical History  Diagnosis Date  . CHF (congestive heart failure)     diastolic dysfunction  . Parkinson disease   . Hypertension   . Osteoarthritis   . Rheumatic heart disease   . H/O mitral valve replacement with mechanical valve   . GERD (gastroesophageal reflux disease)   . CVA (cerebral infarction)     No residual neuro deficits  . Atrial fibrillation   . Pulmonary hypertension     Past Surgical History  Procedure Laterality Date  . Mitral valve replacement    . Esophagogastroduodenoscopy (egd) with propofol N/A 12/16/2014    Procedure: ESOPHAGOGASTRODUODENOSCOPY (EGD) WITH PROPOFOL;  Surgeon: Hulen Luster, MD;  Location: [facility] ENDOSCOPY;  Service: Gastroenterology;  Laterality: N/A;    Family History  Problem Relation Age of Onset  . Cancer Mother     oral cavity  . CAD Father     Social History Social History  Substance Use Topics  . Smoking status: Never Smoker   . Smokeless tobacco: Never Used  .  Alcohol Use: No    Allergies  Allergen Reactions  . Penicillins Shortness Of Breath and Other (See Comments)    Has patient had a PCN reaction causing immediate rash, facial/tongue/throat swelling, SOB or lightheadedness with hypotension: Yes Has patient had a PCN reaction causing severe rash involving mucus membranes or skin necrosis: No Has patient had a PCN reaction that required hospitalization No Has patient had a PCN reaction occurring within the last 10 years: No If all of the above answers are "NO", then may proceed with Cephalosporin use.   Marland Kitchen Doxycycline Rash    Current Facility-Administered Medications  Medication Dose Route Frequency Provider Last Rate Last Dose  . acetaminophen (TYLENOL) tablet 500-1,000 mg  500-1,000 mg Oral Q6H PRN Gladstone Lighter, MD      . antiseptic oral rinse (CPC / CETYLPYRIDINIUM CHLORIDE 0.05%) solution 7 mL  7 mL Mouth Rinse BID Aldean Jewett, MD   7 mL at 12/17/14 0800  . calcium-vitamin D (OSCAL WITH D) 500-200 MG-UNIT per tablet   Oral BID Gladstone Lighter, MD   1 tablet at 12/17/14 1020  . carbidopa-levodopa (SINEMET IR) 25-100 MG per tablet immediate release 2 tablet  2 tablet Oral TID Gladstone Lighter, MD   2 tablet at 12/17/14 1542  . ferrous sulfate tablet 650 mg  650 mg Oral BID Gladstone Lighter, MD   650 mg at 12/17/14 1020  . folic acid (FOLVITE) tablet 1 mg  1 mg Oral Daily Gladstone Lighter, MD   1 mg at  12/17/14 1020  . furosemide (LASIX) tablet 20 mg  20 mg Oral Daily Gladstone Lighter, MD   20 mg at 12/17/14 1020  . levofloxacin (LEVAQUIN) tablet 250 mg  250 mg Oral Daily Aldean Jewett, MD   250 mg at 12/17/14 1527  . metoprolol (LOPRESSOR) tablet 50 mg  50 mg Oral BID Gladstone Lighter, MD   50 mg at 12/17/14 1020  . pantoprazole (PROTONIX) EC tablet 40 mg  40 mg Oral Daily Gladstone Lighter, MD   40 mg at 12/17/14 1020  . potassium chloride SA (K-DUR,KLOR-CON) CR tablet 10 mEq  10 mEq Oral Daily Gladstone Lighter, MD    10 mEq at 12/17/14 1020  . senna-docusate (Senokot-S) tablet 1 tablet  1 tablet Oral QHS PRN Gladstone Lighter, MD      . sodium chloride 0.9 % injection 3 mL  3 mL Intravenous Q12H Gladstone Lighter, MD   3 mL at 12/17/14 1018  . vitamin B-12 (CYANOCOBALAMIN) tablet 1,000 mcg  1,000 mcg Oral Daily Gladstone Lighter, MD   1,000 mcg at 12/17/14 1020     Review of Systems A multi-point review of systems was asked and all pertinent positives and negatives were within the history of present illness.  Physical Exam Blood pressure 103/58, pulse 76, temperature 97.5 F (36.4 C), temperature source Oral, resp. rate 18, height 5\' 7"  (1.702 m), weight 63.549 kg (140 lb 1.6 oz), SpO2 96 %. CONSTITUTIONAL: Resting in bed in no acute distress EYES: Pupils are equal, round, and reactive to light, Sclera are non-icteric. EARS, NOSE, MOUTH AND THROAT: The oropharynx is clear. The oral mucosa is pink and moist. Hearing is intact to voice. LYMPH NODES:  Lymph nodes in the neck are normal. RESPIRATORY:  Lungs are clear. There is normal respiratory effort, with equal breath sounds bilaterally, and without pathologic use of accessory muscles. CARDIOVASCULAR: Heart is regular without murmurs, gallops, or rubs. GI: The abdomen is soft, nontender, and nondistended. There are no palpable masses. There is no hepatosplenomegaly. There are normal bowel sounds in all quadrants. GU: Rectal deferred.   MUSCULOSKELETAL: Normal muscle strength and tone. No cyanosis or edema.   SKIN: Turgor is good and there are no pathologic skin lesions or ulcers. NEUROLOGIC: Motor and sensation is grossly normal. Cranial nerves are grossly intact. PSYCH:  Oriented to person, place and time. Affect is normal.  Data Reviewed I personally reviewed the patient's colonoscopy report and confirmed that it appears to be a likely malignant mass. Difficult to tell location from pictures however reported as near the hepatic flexure I have  personally reviewed the patient's imaging, laboratory findings and medical records.    Assessment    81 year old female with likely malignant mass within the colon    Plan    Discussed with the patient in detail her treatment options to include surgery, chemotherapy, doing nothing. Discussed that any intervention would be dependent on her desires. Prior to discussing the full details of surgery with patient discussed that completing a CT scan would be pertinent so that we could better discuss whether not there is any current evidence of metastatic disease. Discussed that in the setting of metastatic disease surgery is usually reserved for palliation should the tumor become obstructing while she is undergoing an oncologic treatment. If it is spread from her intestines and she responds to chemotherapy surgery within be an option for cure. Also discussed with the patient the option of doing nothing. However this was discouraged and the possibility of obstruction  from this presented to her and she voiced understanding. Patient stated that she be interested in surgery however she wishes to discuss this in detail with her family and to pray on it. She was drinking contrast for CT scan during my interview for which I discussed with her that I would review the results with her in the morning.     Time spent with the patient was 45 minutes, with more than 50% of the time spent in face-to-face education, counseling and care coordination.     Clayburn Pert 12/17/2014, 4:58 PM

## 2014-12-18 DIAGNOSIS — R1900 Intra-abdominal and pelvic swelling, mass and lump, unspecified site: Secondary | ICD-10-CM

## 2014-12-18 DIAGNOSIS — D509 Iron deficiency anemia, unspecified: Secondary | ICD-10-CM

## 2014-12-18 DIAGNOSIS — R948 Abnormal results of function studies of other organs and systems: Secondary | ICD-10-CM

## 2014-12-18 NOTE — Progress Notes (Signed)
[place] [facility] [place] NAME: Gwendolyn Norman    MR#:  [id number]  DATE OF BIRTH:  [birth year]  SUBJECTIVE:  CHIEF COMPLAINT:   Chief Complaint  Patient presents with  . Abnormal Lab   Patient is resting comfortably. Daughter is at her bedside. Concerned about her colon mass and would like to talk to her oncologist Dr. Ma Hillock regarding the same  REVIEW OF SYSTEMS:   Review of Systems  Constitutional: Negative for fever and chills.  HENT: Negative for congestion, hearing loss and tinnitus.   Eyes: Negative for double vision and photophobia.  Respiratory: Negative for shortness of breath.   Cardiovascular: Negative for chest pain and palpitations.  Gastrointestinal: Negative for nausea, vomiting and abdominal pain.  Genitourinary: Negative for dysuria.  Skin: Negative for itching and rash.  Neurological: Negative for headaches.  Psychiatric/Behavioral: The patient is nervous/anxious. The patient does not have insomnia.     DRUG ALLERGIES:   Allergies  Allergen Reactions  . Penicillins Shortness Of Breath and Other (See Comments)    Has patient had a PCN reaction causing immediate rash, facial/tongue/throat swelling, SOB or lightheadedness with hypotension: Yes Has patient had a PCN reaction causing severe rash involving mucus membranes or skin necrosis: No Has patient had a PCN reaction that required hospitalization No Has patient had a PCN reaction occurring within the last 10 years: No If all of the above answers are "NO", then may proceed with Cephalosporin use.   Marland Kitchen Doxycycline Rash    VITALS:  Blood pressure 102/46, pulse 75, temperature 98 F (36.7 C), temperature source Oral, resp. rate 18, height 5\' 7"  (1.702 m), weight 63.549 kg (140 lb 1.6 oz), SpO2 97 %.  PHYSICAL EXAMINATION:  GENERAL:  81 y.o.-year-old patient lying in the bed with no acute distress.  LUNGS: Normal breath sounds bilaterally, no wheezing,  rales,rhonchi or crepitation. No use of accessory muscles of respiration.  CARDIOVASCULAR: S1, S2 normal. No murmurs, rubs, or gallops.  ABDOMEN: Soft, nontender, nondistended. Bowel sounds present. No organomegaly or mass. No guarding no rebound EXTREMITIES: No pedal edema, cyanosis, or clubbing.  NEUROLOGIC: Cranial nerves II through XII are intact. Muscle strength 5/5 in all extremities. Sensation intact. Gait not checked.  PSYCHIATRIC: The patient is alert and oriented to person, poor historian SKIN: No obvious rash, lesion, or ulcer.    LABORATORY PANEL:   CBC  Recent Labs Lab 12/16/14 0838  WBC 5.5  HGB 7.3*  HCT 22.9*  PLT 231   ------------------------------------------------------------------------------------------------------------------  Chemistries   Recent Labs Lab 12/14/14 1225  12/16/14 0838  NA 130*  < > 131*  K 3.1*  < > 3.5  CL 93*  < > 95*  CO2 29  < > 32  GLUCOSE 97  < > 82  BUN 18  < > 15  CREATININE 0.59  < > 0.54  CALCIUM 6.8*  < > 7.1*  AST 18  --   --   ALT <5*  --   --   ALKPHOS 105  --   --   BILITOT 0.6  --   --   < > = values in this interval not displayed. ------------------------------------------------------------------------------------------------------------------  Cardiac Enzymes  Recent Labs Lab 12/14/14 1657  TROPONINI 0.05*   ------------------------------------------------------------------------------------------------------------------  RADIOLOGY:  Ct Abdomen Pelvis W Contrast  12/17/2014   CLINICAL DATA:  Large mass near hepatic flexure? Unable to get scope beyond this site. Bx's taken. Recommend CT to localize mass/check for mets.  Surgical referral since mass is nearly obstructing.  EXAM: CT ABDOMEN AND PELVIS WITH CONTRAST  TECHNIQUE: Multidetector CT imaging of the abdomen and pelvis was performed using the standard protocol following bolus administration of intravenous contrast.  CONTRAST:  29mL OMNIPAQUE IOHEXOL  300 MG/ML  SOLN  COMPARISON:  None applicable  FINDINGS: Lower chest: The heart is markedly enlarged. There is mitral annulus repair. The right atrium is markedly dilated. Left atrium is also dilated. No pericardial effusion. Status post median sternotomy. Coronary artery calcifications are present. There are bilateral pleural effusions. Bibasilar atelectasis is present.  Upper abdomen: No focal abnormality identified within the liver, spleen, pancreas, or adrenal glands. The gallbladder is contracted.  Gastrointestinal tract: Stomach is distended with contrast. The proximal small bowel loops are normal in appearance. Distal small bowel loops are normal in appearance. The ascending colon has thickened wall. The mid transverse colon is involved by a large solid mass measuring 9.7 x 9.6 x 8.6 cm. The lumen of the colon is not discernible within this mass. Despite the presence of a large mass, there is little obstruction proximal to it.  Pelvis: The urinary bladder contains a small amount of air. Question of recent catheterization? The uterus is present. There is a small amount of free pelvic fluid. No adnexal mass.  Retroperitoneum: The abdominal aorta is tortuous and densely calcified. No aneurysm. Small portacaval lymph nodes are identified, largest measuring 1.1 cm.  Abdominal wall: There is diffuse body wall edema. Status post median sternotomy.  Osseous structures: No suspicious lytic or blastic lesions are identified. Scoliosis, convex right  IMPRESSION: 1. Large lobulated mass involving the mid transverse colon, measuring at least 9.7 cm. Ascending colon has a thickened wall. However there is little obstruction due to the mass. 2. No evidence for hepatic metastases. 3. Small portacaval lymph nodes. 4. Diffuse body wall edema. 5. Small amount of ascites. 6. Marked enlargement a hard with dilated right atrium and left atrium. 7. Previous mitral annulus repair. 8. Bilateral pleural effusions, right greater than  left. 9. Air within the urinary bladder. Question of recent catheterization. 10. Scoliosis and degenerative disc disease.   Electronically Signed   By: Nolon Nations M.D.   On: 12/17/2014 17:01    EKG:   Orders placed or performed during the hospital encounter of 12/14/14  . EKG 12-Lead  . EKG 12-Lead  . EKG 12-Lead  . EKG 12-Lead  . EKG 12-Lead  . 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 - Discussed this with the patient and her daughter today, would like to discuss with her oncologist Dr. Ma Hillock and. Surgery regarding possible options - INR is normal at this time - Continue Protonix -We will consider palliative care consult if needed  Urinary tract infection - Treated, UA is negative  Mechanical mitral valve - Appreciate cardiology consultation - INR is back to normal, will hold warfarin at this time in anticipation of procedure  - Resume Coumadin after procedure  Parkinson's - Continue Sinemet  Hypertension - Blood pressure low normal - Hold amlodipine  CODE STATUS: Full  TOTAL TIME TAKING CARE OF THIS PATIENT: 35 minutes.  Greater than 50% of time spent in care coordination and counseling. POSSIBLE D/C IN 1-2 DAYS, DEPENDING ON CLINICAL CONDITION.   Nicholes Mango M.D on 12/18/2014 at 3:11 PM  Between 7am to 6pm - Pager - [phone]  After 6pm go to [email or web address] - password [email or web address] [facility]  [place]  [facility]  Office  [phone]  CC: Primary care physician; Sherrin Daisy, MD

## 2014-12-18 NOTE — Progress Notes (Signed)
CC: Symptomatically anemia Subjective: 81 year old female admitted medicine department workup with symptomatic anemia. Found to have a large tumor on colonoscopy yesterday. Patient continues to be minimally symptomatic but weak. Denies any nausea, vomiting, constipation, diarrhea. She is very interested in another results her CT scan from last night.  Objective: Vital signs in last 24 hours: Temp:  [96.4 F (35.8 C)-98.4 F (36.9 C)] 98.3 F (36.8 C) (09/24 0442) Pulse Rate:  [59-125] 90 (09/24 0442) Resp:  [16-23] 23 (09/24 0442) BP: (84-121)/(49-84) 109/53 mmHg (09/24 0442) SpO2:  [81 %-100 %] 96 % (09/24 0442) Last BM Date: 12/17/14  Intake/Output from previous day: 09/23 0701 - 09/24 0700 In: -  Out: 250 [Urine:250] Intake/Output this shift:    Physical exam:  Gen.: No acute distress Chest: Mechanical heart valve audible. Lungs are clear to auscultation Abdomen: Soft, nontender, nondistended.  Lab Results: CBC   Recent Labs  12/15/14 1434 12/16/14 0838  WBC  --  5.5  HGB 7.2* 7.3*  HCT  --  22.9*  PLT  --  231   BMET  Recent Labs  12/16/14 0838  NA 131*  K 3.5  CL 95*  CO2 32  GLUCOSE 82  BUN 15  CREATININE 0.54  CALCIUM 7.1*   PT/INR  Recent Labs  12/16/14 0443  LABPROT 18.1*  INR 1.48   ABG No results for input(s): PHART, HCO3 in the last 72 hours.  Invalid input(s): PCO2, PO2  Studies/Results: Ct Abdomen Pelvis W Contrast  12/17/2014   CLINICAL DATA:  Large mass near hepatic flexure? Unable to get scope beyond this site. Bx's taken. Recommend CT to localize mass/check for mets. Surgical referral since mass is nearly obstructing.  EXAM: CT ABDOMEN AND PELVIS WITH CONTRAST  TECHNIQUE: Multidetector CT imaging of the abdomen and pelvis was performed using the standard protocol following bolus administration of intravenous contrast.  CONTRAST:  30mL OMNIPAQUE IOHEXOL 300 MG/ML  SOLN  COMPARISON:  None applicable  FINDINGS: Lower chest: The  heart is markedly enlarged. There is mitral annulus repair. The right atrium is markedly dilated. Left atrium is also dilated. No pericardial effusion. Status post median sternotomy. Coronary artery calcifications are present. There are bilateral pleural effusions. Bibasilar atelectasis is present.  Upper abdomen: No focal abnormality identified within the liver, spleen, pancreas, or adrenal glands. The gallbladder is contracted.  Gastrointestinal tract: Stomach is distended with contrast. The proximal small bowel loops are normal in appearance. Distal small bowel loops are normal in appearance. The ascending colon has thickened wall. The mid transverse colon is involved by a large solid mass measuring 9.7 x 9.6 x 8.6 cm. The lumen of the colon is not discernible within this mass. Despite the presence of a large mass, there is little obstruction proximal to it.  Pelvis: The urinary bladder contains a small amount of air. Question of recent catheterization? The uterus is present. There is a small amount of free pelvic fluid. No adnexal mass.  Retroperitoneum: The abdominal aorta is tortuous and densely calcified. No aneurysm. Small portacaval lymph nodes are identified, largest measuring 1.1 cm.  Abdominal wall: There is diffuse body wall edema. Status post median sternotomy.  Osseous structures: No suspicious lytic or blastic lesions are identified. Scoliosis, convex right  IMPRESSION: 1. Large lobulated mass involving the mid transverse colon, measuring at least 9.7 cm. Ascending colon has a thickened wall. However there is little obstruction due to the mass. 2. No evidence for hepatic metastases. 3. Small portacaval lymph nodes. 4. Diffuse  body wall edema. 5. Small amount of ascites. 6. Marked enlargement a hard with dilated right atrium and left atrium. 7. Previous mitral annulus repair. 8. Bilateral pleural effusions, right greater than left. 9. Air within the urinary bladder. Question of recent catheterization.  10. Scoliosis and degenerative disc disease.   Electronically Signed   By: Nolon Nations M.D.   On: 12/17/2014 17:01    Anti-infectives: Anti-infectives    Start     Dose/Rate Route Frequency Ordered Stop   12/17/14 1445  levofloxacin (LEVAQUIN) tablet 250 mg     250 mg Oral Daily 12/17/14 1431     12/17/14 0930  ciprofloxacin (CIPRO) IVPB 400 mg     400 mg 200 mL/hr over 60 Minutes Intravenous  Once 12/17/14 0924 12/17/14 1116   12/16/14 1400  ciprofloxacin (CIPRO) IVPB 400 mg  Status:  Discontinued     400 mg 200 mL/hr over 60 Minutes Intravenous  Once 12/16/14 1347 12/16/14 1351   12/16/14 1100  ciprofloxacin (CIPRO) IVPB 400 mg     400 mg 200 mL/hr over 60 Minutes Intravenous  Once 12/16/14 1050 12/16/14 1217      Assessment/Plan:  81 year old female with a large abdominal mass. Had a long conversation with the patient about the findings of her CT scan and that it shows that the mass is well beyond the borders of the colon. With it being a nearly 10 cm lobulated mass that involves the transverse colon there are numerous more diagnoses that this could represent. Also discussed that should she choose to have a surgical intervention is possibility of finding carcinomatosis which would then warrant a biopsy and not to have a surgery for cure. Also discussed that this mass is not causing any obstructive symptoms there is no emergency to perform surgery at this time. Gave her the options of planning for surgery, awaiting biopsy results and planning for chemotherapy, doing nothing. Discussed in great length that the decision for her treatment is up to her that we would on her her desires for treatment. Told her not to make any decisions immediately but to talk with family and think on it. We'll revisit with her later today and again tomorrow to answer any and all questions prior to making any surgical plans.  Charles T. Adonis Huguenin, MD, FACS  12/18/2014

## 2014-12-18 NOTE — Consult Note (Signed)
[place]  [phone] [phone]  ID: Gwendolyn Norman OB: [birth year]  MR#: [id number]  MHD#:[id number]  Patient Care Team: Sherrin Daisy, MD as PCP - General (Family Medicine)  CHIEF COMPLAINT:  Chief Complaint  Patient presents with  . Abnormal Lab    INTERVAL HISTORY: Patient is an 81 year old female who has been followed in the [place] by Dr. Ma Hillock for several years for iron deficiency anemia. She recently presented to the hospital with increasing weakness. Her hemoglobin was found to have been slightly decreased from 9 to approximately 7.7. Patient admitted to having dark stools, but thought it was secondary to taking oral iron. Subsequent colonoscopy as well as CT scan revealed large mass highly suspicious for underlying malignancy. Pathology is pending at this time. Patient feels improved since admission and offers no specific complaints today.  REVIEW OF SYSTEMS:   Review of Systems  Constitutional: Positive for malaise/fatigue. Negative for fever and weight loss.  Respiratory: Negative.   Cardiovascular: Negative.   Gastrointestinal: Positive for melena. Negative for abdominal pain and diarrhea.  Musculoskeletal: Negative.   Neurological: Positive for weakness.    As per HPI. Otherwise, a complete review of systems is negatve.  PAST MEDICAL HISTORY: Past Medical History  Diagnosis Date  . CHF (congestive heart failure)     diastolic dysfunction  . Parkinson disease   . Hypertension   . Osteoarthritis   . Rheumatic heart disease   . H/O mitral valve replacement with mechanical valve   . GERD (gastroesophageal reflux disease)   . CVA (cerebral infarction)     No residual neuro deficits  . Atrial fibrillation   . Pulmonary hypertension     PAST SURGICAL HISTORY: Past Surgical History  Procedure Laterality Date  . Mitral valve replacement    . Esophagogastroduodenoscopy (egd) with propofol N/A 12/16/2014   Procedure: ESOPHAGOGASTRODUODENOSCOPY (EGD) WITH PROPOFOL;  Surgeon: Hulen Luster, MD;  Location: [facility] ENDOSCOPY;  Service: Gastroenterology;  Laterality: N/A;    FAMILY HISTORY Family History  Problem Relation Age of Onset  . Cancer Mother     oral cavity  . CAD Father        ADVANCED DIRECTIVES:    HEALTH MAINTENANCE: Social History  Substance Use Topics  . Smoking status: Never Smoker   . Smokeless tobacco: Never Used  . Alcohol Use: No     Colonoscopy:  PAP:  Bone density:  Lipid panel:  Allergies  Allergen Reactions  . Penicillins Shortness Of Breath and Other (See Comments)    Has patient had a PCN reaction causing immediate rash, facial/tongue/throat swelling, SOB or lightheadedness with hypotension: Yes Has patient had a PCN reaction causing severe rash involving mucus membranes or skin necrosis: No Has patient had a PCN reaction that required hospitalization No Has patient had a PCN reaction occurring within the last 10 years: No If all of the above answers are "NO", then may proceed with Cephalosporin use.   Marland Kitchen Doxycycline Rash    Current Facility-Administered Medications  Medication Dose Route Frequency Provider Last Rate Last Dose  . acetaminophen (TYLENOL) tablet 500-1,000 mg  500-1,000 mg Oral Q6H PRN Gladstone Lighter, MD      . antiseptic oral rinse (CPC / CETYLPYRIDINIUM CHLORIDE 0.05%) solution 7 mL  7 mL Mouth Rinse BID Aldean Jewett, MD   7 mL at 12/18/14 0800  . calcium-vitamin D (OSCAL WITH D) 500-200 MG-UNIT per tablet   Oral BID Gladstone Lighter, MD   1 tablet at  12/18/14 5093  . carbidopa-levodopa (SINEMET IR) 25-100 MG per tablet immediate release 2 tablet  2 tablet Oral TID Gladstone Lighter, MD   2 tablet at 12/18/14 1634  . ferrous sulfate tablet 650 mg  650 mg Oral BID Gladstone Lighter, MD   650 mg at 12/18/14 0942  . folic acid (FOLVITE) tablet 1 mg  1 mg Oral Daily Gladstone Lighter, MD   1 mg at 12/18/14 0943  . furosemide (LASIX)  tablet 20 mg  20 mg Oral Daily Gladstone Lighter, MD   20 mg at 12/18/14 0943  . levofloxacin (LEVAQUIN) tablet 250 mg  250 mg Oral Daily Aldean Jewett, MD   250 mg at 12/18/14 [phone]  . metoprolol (LOPRESSOR) tablet 50 mg  50 mg Oral BID Gladstone Lighter, MD   50 mg at 12/18/14 0942  . pantoprazole (PROTONIX) EC tablet 40 mg  40 mg Oral Daily Gladstone Lighter, MD   40 mg at 12/18/14 0943  . potassium chloride SA (K-DUR,KLOR-CON) CR tablet 10 mEq  10 mEq Oral Daily Gladstone Lighter, MD   10 mEq at 12/18/14 0942  . senna-docusate (Senokot-S) tablet 1 tablet  1 tablet Oral QHS PRN Gladstone Lighter, MD      . sodium chloride 0.9 % injection 3 mL  3 mL Intravenous Q12H Gladstone Lighter, MD   3 mL at 12/18/14 0944  . vitamin B-12 (CYANOCOBALAMIN) tablet 1,000 mcg  1,000 mcg Oral Daily Gladstone Lighter, MD   1,000 mcg at 12/18/14 0943    OBJECTIVE: Filed Vitals:   12/18/14 1129  BP: 102/46  Pulse: 75  Temp: 98 F (36.7 C)  Resp: 18     Body mass index is 21.94 kg/(m^2).    ECOG FS:1 - Symptomatic but completely ambulatory  General: Well-developed, well-nourished, no acute distress. Eyes: Pink conjunctiva, anicteric sclera. Lungs: Clear to auscultation bilaterally. Heart: Regular rate and rhythm. No rubs, murmurs, or gallops. Abdomen: Soft, nontender, nondistended. No organomegaly noted, normoactive bowel sounds. Musculoskeletal: No edema, cyanosis, or clubbing. Neuro: Alert, answering all questions appropriately. Cranial nerves grossly intact. Skin: No rashes or petechiae noted. Psych: Normal affect.  LAB RESULTS:  Lab Results  Component Value Date   NA 131* 12/16/2014   K 3.5 12/16/2014   CL 95* 12/16/2014   CO2 32 12/16/2014   GLUCOSE 82 12/16/2014   BUN 15 12/16/2014   CREATININE 0.54 12/16/2014   CALCIUM 7.1* 12/16/2014   PROT 5.1* 12/14/2014   ALBUMIN 1.2* 12/14/2014   AST 18 12/14/2014   ALT <5* 12/14/2014   ALKPHOS 105 12/14/2014   BILITOT 0.6 12/14/2014    GFRNONAA >60 12/16/2014   GFRAA >60 12/16/2014    Lab Results  Component Value Date   WBC 5.5 12/16/2014   NEUTROABS 5.9 12/08/2014   HGB 7.3* 12/16/2014   HCT 22.9* 12/16/2014   MCV 83.0 12/16/2014   PLT 231 12/16/2014     STUDIES: Dg Chest 2 View  12/14/2014   CLINICAL DATA:  81 year old female with 1 year history of cough.  EXAM: CHEST  2 VIEW  COMPARISON:  Prior chest x-ray 12/08/2014  FINDINGS: Unchanged massive enlargement of the cardiopericardial silhouette. Patient is status post median sternotomy. Atherosclerotic calcification present within the transverse aorta. No evidence of pulmonary edema. Small right pleural effusion with associated right basilar atelectasis. No focal scratch then no acute osseous abnormality.  IMPRESSION: 1. Stable chest x-ray with persistent massive enlargement of the cardiopericardial silhouette which may reflect cardiomegaly and/or pericardial effusion. 2. Small right  pleural effusion with associated right lower lobe atelectasis. 3. Negative for pulmonary edema.   Electronically Signed   By: Jacqulynn Cadet M.D.   On: 12/14/2014 20:09   Ct Abdomen Pelvis W Contrast  12/17/2014   CLINICAL DATA:  Large mass near hepatic flexure? Unable to get scope beyond this site. Bx's taken. Recommend CT to localize mass/check for mets. Surgical referral since mass is nearly obstructing.  EXAM: CT ABDOMEN AND PELVIS WITH CONTRAST  TECHNIQUE: Multidetector CT imaging of the abdomen and pelvis was performed using the standard protocol following bolus administration of intravenous contrast.  CONTRAST:  30mL OMNIPAQUE IOHEXOL 300 MG/ML  SOLN  COMPARISON:  None applicable  FINDINGS: Lower chest: The heart is markedly enlarged. There is mitral annulus repair. The right atrium is markedly dilated. Left atrium is also dilated. No pericardial effusion. Status post median sternotomy. Coronary artery calcifications are present. There are bilateral pleural effusions. Bibasilar  atelectasis is present.  Upper abdomen: No focal abnormality identified within the liver, spleen, pancreas, or adrenal glands. The gallbladder is contracted.  Gastrointestinal tract: Stomach is distended with contrast. The proximal small bowel loops are normal in appearance. Distal small bowel loops are normal in appearance. The ascending colon has thickened wall. The mid transverse colon is involved by a large solid mass measuring 9.7 x 9.6 x 8.6 cm. The lumen of the colon is not discernible within this mass. Despite the presence of a large mass, there is little obstruction proximal to it.  Pelvis: The urinary bladder contains a small amount of air. Question of recent catheterization? The uterus is present. There is a small amount of free pelvic fluid. No adnexal mass.  Retroperitoneum: The abdominal aorta is tortuous and densely calcified. No aneurysm. Small portacaval lymph nodes are identified, largest measuring 1.1 cm.  Abdominal wall: There is diffuse body wall edema. Status post median sternotomy.  Osseous structures: No suspicious lytic or blastic lesions are identified. Scoliosis, convex right  IMPRESSION: 1. Large lobulated mass involving the mid transverse colon, measuring at least 9.7 cm. Ascending colon has a thickened wall. However there is little obstruction due to the mass. 2. No evidence for hepatic metastases. 3. Small portacaval lymph nodes. 4. Diffuse body wall edema. 5. Small amount of ascites. 6. Marked enlargement a hard with dilated right atrium and left atrium. 7. Previous mitral annulus repair. 8. Bilateral pleural effusions, right greater than left. 9. Air within the urinary bladder. Question of recent catheterization. 10. Scoliosis and degenerative disc disease.   Electronically Signed   By: Nolon Nations M.D.   On: 12/17/2014 17:01   US Venous Img Lower Bilateral  12/14/2014   CLINICAL DATA:  Chronic bilateral lower extremity pain and swelling, discoloration.  EXAM: BILATERAL LOWER  EXTREMITY VENOUS DOPPLER ULTRASOUND  TECHNIQUE: Gray-scale sonography with graded compression, as well as color Doppler and duplex ultrasound were performed to evaluate the lower extremity deep venous systems from the level of the common femoral vein and including the common femoral, femoral, profunda femoral, popliteal and calf veins including the posterior tibial, peroneal and gastrocnemius veins when visible. The superficial great saphenous vein was also interrogated. Spectral Doppler was utilized to evaluate flow at rest and with distal augmentation maneuvers in the common femoral, femoral and popliteal veins.  COMPARISON:  None.  FINDINGS: RIGHT LOWER EXTREMITY  Common Femoral Vein: No evidence of thrombus. Normal compressibility, respiratory phasicity and response to augmentation.  Saphenofemoral Junction: No evidence of thrombus. Normal compressibility and flow on color Doppler  imaging.  Profunda Femoral Vein: No evidence of thrombus. Normal compressibility and flow on color Doppler imaging.  Femoral Vein: No evidence of thrombus. Normal compressibility, respiratory phasicity and response to augmentation.  Popliteal Vein: No evidence of thrombus. Normal compressibility, respiratory phasicity and response to augmentation.  Calf Veins: No evidence of thrombus. Normal compressibility and flow on color Doppler imaging.  Superficial Great Saphenous Vein: No evidence of thrombus. Normal compressibility and flow on color Doppler imaging.  Venous Reflux:  None.  Other Findings: Right lower extremity subcutaneous peripheral edema noted. Right inguinal prominent lymph node measures 18 x 13 x 7 mm.  LEFT LOWER EXTREMITY  Common Femoral Vein: No evidence of thrombus. Normal compressibility, respiratory phasicity and response to augmentation.  Saphenofemoral Junction: No evidence of thrombus. Normal compressibility and flow on color Doppler imaging.  Profunda Femoral Vein: No evidence of thrombus. Normal compressibility  and flow on color Doppler imaging.  Femoral Vein: No evidence of thrombus. Normal compressibility, respiratory phasicity and response to augmentation.  Popliteal Vein: No evidence of thrombus. Normal compressibility, respiratory phasicity and response to augmentation.  Calf Veins: No evidence of thrombus. Normal compressibility and flow on color Doppler imaging.  Superficial Great Saphenous Vein: No evidence of thrombus. Normal compressibility and flow on color Doppler imaging.  Venous Reflux:  None.  Other Findings: Similar left lower extremity subcutaneous edema noted.  IMPRESSION: Negative for significant DVT in either extremity.  Peripheral subcutaneous edema  Borderline right inguinal adenopathy   Electronically Signed   By: Jerilynn Mages.  Shick M.D.   On: 12/14/2014 16:46   Dg Chest Port 1 View  12/08/2014   CLINICAL DATA:  Three-week history of CHF.  EXAM: PORTABLE CHEST - 1 VIEW  COMPARISON:  10/04/2012  FINDINGS: The heart is enlarged but stable. There is tortuosity and calcification of the thoracic aorta. The lungs are grossly clear. No edema or definite pleural effusion. Low lung volumes with vascular crowding and streaky basilar atelectasis. The bony thorax is intact.  IMPRESSION: Stable cardiac enlargement.  No acute pulmonary findings.   Electronically Signed   By: Marijo Sanes M.D.   On: 12/08/2014 15:51    ASSESSMENT: Colon cancer, pathology pending.  PLAN:    1. Colon cancer: CT scan results reviewed independently. Patient had recent colonoscopy with biopsies to confirm diagnosis. She likely will agree to surgery, but states she is unclear if she would want any further treatment. She would like to further discuss with Dr. Ma Hillock when he returns on Monday. Appreciate surgical and GI input. CEA has been ordered and is currently pending. 2. Iron deficiency anemia: Likely secondary to underlying malignancy. Patient's iron stores are within normal limits, her hemoglobin is decreased but stable. Patient  is a Jehovah witness and is refusing any blood transfusion. Monitor. 3. Anticoagulation: On hold from recent colonoscopy. Agree with hospitalist that this would be an opportune time to proceed with surgery to minimize any risk for clotting on her aortic valve.  Appreciate consult, will follow.  Lloyd Huger, MD   12/18/2014 5:10 PM

## 2014-12-19 ENCOUNTER — Encounter: Payer: Self-pay | Admitting: Gastroenterology

## 2014-12-19 LAB — CBC
HCT: 20.4 % — ABNORMAL LOW (ref 35.0–47.0)
Hemoglobin: 6.6 g/dL — ABNORMAL LOW (ref 12.0–16.0)
MCH: 26.6 pg (ref 26.0–34.0)
MCHC: 32.4 g/dL (ref 32.0–36.0)
MCV: 81.9 fL (ref 80.0–100.0)
PLATELETS: 178 10*3/uL (ref 150–440)
RBC: 2.49 MIL/uL — AB (ref 3.80–5.20)
RDW: 19.9 % — AB (ref 11.5–14.5)
WBC: 5.8 10*3/uL (ref 3.6–11.0)

## 2014-12-19 MED ORDER — SODIUM CHLORIDE 0.9 % IV SOLN
100.0000 mg | Freq: Once | INTRAVENOUS | Status: AC
  Administered 2014-12-19: 100 mg via INTRAVENOUS
  Filled 2014-12-19: qty 5

## 2014-12-19 NOTE — Plan of Care (Addendum)
Problem: Discharge Progression Outcomes Goal: Complications resolved/controlled Outcome: Not Progressing Pt is alert and oriented x 4, hard of hearing, daughter at bedside, hx of afib, currently not on blood thinner, Dr. Margaretmary Eddy thinks lovenox is too risky at this time due to decreased hgb, resting in bed quietly, fair appetite, hgb 6.6, pt does not accept whole blood products, received IV iron, consulted with surgery, will likely have surgery on 9/27 for rectal cancer.

## 2014-12-19 NOTE — Progress Notes (Signed)
[facility] Encounter Note  Patient: Gwendolyn Norman / Admit Date: 12/14/2014 / Date of Encounter: 12/19/2014, 6:54 PM   Subjective: Weakness and fatigue and shortness of breath but no evidence of hemodynamic changes or cardiac symptoms Patient has a significantly lower hemoglobin at this time but no overt bleeding  Review of Systems: Positive for: Shortness of breath Negative for: Vision change, hearing change, syncope, dizziness, nausea, vomiting,diarrhea, bloody stool, stomach pain, cough, congestion, diaphoresis, urinary frequency, urinary pain,skin lesions, skin rashes Others previously listed  Objective: Telemetry: Atrial fibrillation Physical Exam: Blood pressure 110/57, pulse 84, temperature 99.3 F (37.4 C), temperature source Oral, resp. rate 16, height 5\' 7"  (1.702 m), weight 140 lb 1.6 oz (63.549 kg), SpO2 98 %. Body mass index is 21.94 kg/(m^2). General: Well developed, well nourished, in no acute distress. Head: Normocephalic, atraumatic, sclera non-icteric, no xanthomas, nares are without discharge. Neck: No apparent masses Lungs: Normal respirations with no wheezes, no rhonchi, no rales , no crackles   Heart: Irregular rate and rhythm, mechanical S1 S2, apical murmur, no rub, no gallop, PMI is normal size and placement, carotid upstroke normal without bruit, jugular venous pressure normal Abdomen: Soft, non-tender, non-distended with normoactive bowel sounds. No hepatosplenomegaly. Abdominal aorta is normal size without bruit Extremities: 1+ edema, no clubbing, no cyanosis, no ulcers,  Peripheral: 2+ radial, 2+ femoral, 2+ dorsal pedal pulses Neuro: Alert and oriented. Moves all extremities spontaneously. Psych:  Responds to questions appropriately with a normal affect.   Intake/Output Summary (Last 24 hours) at 12/19/14 1854 Last data filed at 12/19/14 1847  Gross per 24 hour  Intake    340 ml  Output    800 ml  Net   -460 ml    Inpatient  Medications:  . antiseptic oral rinse  7 mL Mouth Rinse BID  . calcium-vitamin D   Oral BID  . carbidopa-levodopa  2 tablet Oral TID  . ferrous sulfate  650 mg Oral BID  . folic acid  1 mg Oral Daily  . furosemide  20 mg Oral Daily  . levofloxacin  250 mg Oral Daily  . metoprolol  50 mg Oral BID  . pantoprazole  40 mg Oral Daily  . potassium chloride  10 mEq Oral Daily  . sodium chloride  3 mL Intravenous Q12H  . vitamin B-12  1,000 mcg Oral Daily   Infusions:    Labs: No results for input(s): NA, K, CL, CO2, GLUCOSE, BUN, CREATININE, CALCIUM, MG, PHOS in the last 72 hours. No results for input(s): AST, ALT, ALKPHOS, BILITOT, PROT, ALBUMIN in the last 72 hours.  Recent Labs  12/19/14 0403  WBC 5.8  HGB 6.6*  HCT 20.4*  MCV 81.9  PLT 178   No results for input(s): CKTOTAL, CKMB, TROPONINI in the last 72 hours. Invalid input(s): POCBNP No results for input(s): HGBA1C in the last 72 hours.   Weights: Filed Weights   12/14/14 1222 12/14/14 1834  Weight: 130 lb (58.968 kg) 140 lb 1.6 oz (63.549 kg)     Radiology/Studies:  Dg Chest 2 View  12/14/2014   CLINICAL DATA:  81 year old female with 1 year history of cough.  EXAM: CHEST  2 VIEW  COMPARISON:  Prior chest x-ray 12/08/2014  FINDINGS: Unchanged massive enlargement of the cardiopericardial silhouette. Patient is status post median sternotomy. Atherosclerotic calcification present within the transverse aorta. No evidence of pulmonary edema. Small right pleural effusion with associated right basilar atelectasis. No focal scratch then no acute osseous  abnormality.  IMPRESSION: 1. Stable chest x-ray with persistent massive enlargement of the cardiopericardial silhouette which may reflect cardiomegaly and/or pericardial effusion. 2. Small right pleural effusion with associated right lower lobe atelectasis. 3. Negative for pulmonary edema.   Electronically Signed   By: Jacqulynn Cadet M.D.   On: 12/14/2014 20:09   Ct Abdomen  Pelvis W Contrast  12/17/2014   CLINICAL DATA:  Large mass near hepatic flexure? Unable to get scope beyond this site. Bx's taken. Recommend CT to localize mass/check for mets. Surgical referral since mass is nearly obstructing.  EXAM: CT ABDOMEN AND PELVIS WITH CONTRAST  TECHNIQUE: Multidetector CT imaging of the abdomen and pelvis was performed using the standard protocol following bolus administration of intravenous contrast.  CONTRAST:  30mL OMNIPAQUE IOHEXOL 300 MG/ML  SOLN  COMPARISON:  None applicable  FINDINGS: Lower chest: The heart is markedly enlarged. There is mitral annulus repair. The right atrium is markedly dilated. Left atrium is also dilated. No pericardial effusion. Status post median sternotomy. Coronary artery calcifications are present. There are bilateral pleural effusions. Bibasilar atelectasis is present.  Upper abdomen: No focal abnormality identified within the liver, spleen, pancreas, or adrenal glands. The gallbladder is contracted.  Gastrointestinal tract: Stomach is distended with contrast. The proximal small bowel loops are normal in appearance. Distal small bowel loops are normal in appearance. The ascending colon has thickened wall. The mid transverse colon is involved by a large solid mass measuring 9.7 x 9.6 x 8.6 cm. The lumen of the colon is not discernible within this mass. Despite the presence of a large mass, there is little obstruction proximal to it.  Pelvis: The urinary bladder contains a small amount of air. Question of recent catheterization? The uterus is present. There is a small amount of free pelvic fluid. No adnexal mass.  Retroperitoneum: The abdominal aorta is tortuous and densely calcified. No aneurysm. Small portacaval lymph nodes are identified, largest measuring 1.1 cm.  Abdominal wall: There is diffuse body wall edema. Status post median sternotomy.  Osseous structures: No suspicious lytic or blastic lesions are identified. Scoliosis, convex right   IMPRESSION: 1. Large lobulated mass involving the mid transverse colon, measuring at least 9.7 cm. Ascending colon has a thickened wall. However there is little obstruction due to the mass. 2. No evidence for hepatic metastases. 3. Small portacaval lymph nodes. 4. Diffuse body wall edema. 5. Small amount of ascites. 6. Marked enlargement a hard with dilated right atrium and left atrium. 7. Previous mitral annulus repair. 8. Bilateral pleural effusions, right greater than left. 9. Air within the urinary bladder. Question of recent catheterization. 10. Scoliosis and degenerative disc disease.   Electronically Signed   By: Nolon Nations M.D.   On: 12/17/2014 17:01   US Venous Img Lower Bilateral  12/14/2014   CLINICAL DATA:  Chronic bilateral lower extremity pain and swelling, discoloration.  EXAM: BILATERAL LOWER EXTREMITY VENOUS DOPPLER ULTRASOUND  TECHNIQUE: Gray-scale sonography with graded compression, as well as color Doppler and duplex ultrasound were performed to evaluate the lower extremity deep venous systems from the level of the common femoral vein and including the common femoral, femoral, profunda femoral, popliteal and calf veins including the posterior tibial, peroneal and gastrocnemius veins when visible. The superficial great saphenous vein was also interrogated. Spectral Doppler was utilized to evaluate flow at rest and with distal augmentation maneuvers in the common femoral, femoral and popliteal veins.  COMPARISON:  None.  FINDINGS: RIGHT LOWER EXTREMITY  Common Femoral Vein: No  evidence of thrombus. Normal compressibility, respiratory phasicity and response to augmentation.  Saphenofemoral Junction: No evidence of thrombus. Normal compressibility and flow on color Doppler imaging.  Profunda Femoral Vein: No evidence of thrombus. Normal compressibility and flow on color Doppler imaging.  Femoral Vein: No evidence of thrombus. Normal compressibility, respiratory phasicity and response to  augmentation.  Popliteal Vein: No evidence of thrombus. Normal compressibility, respiratory phasicity and response to augmentation.  Calf Veins: No evidence of thrombus. Normal compressibility and flow on color Doppler imaging.  Superficial Great Saphenous Vein: No evidence of thrombus. Normal compressibility and flow on color Doppler imaging.  Venous Reflux:  None.  Other Findings: Right lower extremity subcutaneous peripheral edema noted. Right inguinal prominent lymph node measures 18 x 13 x 7 mm.  LEFT LOWER EXTREMITY  Common Femoral Vein: No evidence of thrombus. Normal compressibility, respiratory phasicity and response to augmentation.  Saphenofemoral Junction: No evidence of thrombus. Normal compressibility and flow on color Doppler imaging.  Profunda Femoral Vein: No evidence of thrombus. Normal compressibility and flow on color Doppler imaging.  Femoral Vein: No evidence of thrombus. Normal compressibility, respiratory phasicity and response to augmentation.  Popliteal Vein: No evidence of thrombus. Normal compressibility, respiratory phasicity and response to augmentation.  Calf Veins: No evidence of thrombus. Normal compressibility and flow on color Doppler imaging.  Superficial Great Saphenous Vein: No evidence of thrombus. Normal compressibility and flow on color Doppler imaging.  Venous Reflux:  None.  Other Findings: Similar left lower extremity subcutaneous edema noted.  IMPRESSION: Negative for significant DVT in either extremity.  Peripheral subcutaneous edema  Borderline right inguinal adenopathy   Electronically Signed   By: Jerilynn Mages.  Shick M.D.   On: 12/14/2014 16:46   Dg Chest Port 1 View  12/08/2014   CLINICAL DATA:  Three-week history of CHF.  EXAM: PORTABLE CHEST - 1 VIEW  COMPARISON:  10/04/2012  FINDINGS: The heart is enlarged but stable. There is tortuosity and calcification of the thoracic aorta. The lungs are grossly clear. No edema or definite pleural effusion. Low lung volumes with  vascular crowding and streaky basilar atelectasis. The bony thorax is intact.  IMPRESSION: Stable cardiac enlargement.  No acute pulmonary findings.   Electronically Signed   By: Marijo Sanes M.D.   On: 12/08/2014 15:51     Assessment and Recommendation  81 y.o. female with known mitral valve disease status post [employer] Jude mechanical mitral valve replacement in the remote past with atrial fibrillation now having significant GI bleed with severe anemia likely secondary to abdominal mass needing surgical intervention. The patient has had no evidence of true angina and/or heart failure at this time and is at lowest risk possible for surgical intervention. The patient is a Jehovah witness for which she will not take any blood transfusions and therefore will not be able to give packed red blood cells for significant anemia as well as has a high risk of concerns of bleeding prior to surgery if heparin is used. We have discussed at length with the patient that she does have multiple types of risks and after further discussion will not use anticoagulation prior to surgery due to concerns of worsening bleeding risk while eating to help with stroke risk and thrombosis risk of mitral valve as much as possible. Therefore after surgery we will restart heparin as quickly as possible within reason as per surgery to reduce possible thrombosis of mitral valve and stroke risk with atrial fibrillation.  Signed, Serafina Royals M.D. FACC

## 2014-12-19 NOTE — Progress Notes (Signed)
Telephone call to Dr. Margaretmary Eddy, patients daughter wants to know why she isnt on lovenox, per MD hgb is too low.

## 2014-12-19 NOTE — Progress Notes (Signed)
[facility] NAME: Gwendolyn Norman    MR#:  [id number]  DATE OF BIRTH:  25-Nov-1931  SUBJECTIVE:  CHIEF COMPLAINT:   Chief Complaint  Patient presents with  . Abnormal Lab   Patient is resting comfortably. Daughter is at her bedside. Concerned about her colon mass and would like to proceed with surgery. She is [place] witness but will accept iron products  REVIEW OF SYSTEMS:   Review of Systems  Constitutional: Negative for fever and chills.  HENT: Negative for congestion, hearing loss and tinnitus.   Eyes: Negative for double vision and photophobia.  Respiratory: Negative for shortness of breath.   Cardiovascular: Negative for chest pain and palpitations.  Gastrointestinal: Negative for nausea, vomiting and abdominal pain.  Genitourinary: Negative for dysuria.  Skin: Negative for itching and rash.  Neurological: Negative for headaches.  Psychiatric/Behavioral: The patient is not nervous/anxious and does not have insomnia.     DRUG ALLERGIES:   Allergies  Allergen Reactions  . Penicillins Shortness Of Breath and Other (See Comments)    Has patient had a PCN reaction causing immediate rash, facial/tongue/throat swelling, SOB or lightheadedness with hypotension: Yes Has patient had a PCN reaction causing severe rash involving mucus membranes or skin necrosis: No Has patient had a PCN reaction that required hospitalization No Has patient had a PCN reaction occurring within the last 10 years: No If all of the above answers are "NO", then may proceed with Cephalosporin use.   Marland Kitchen Doxycycline Rash    VITALS:  Blood pressure 103/52, pulse 83, temperature 99.2 F (37.3 C), temperature source Oral, resp. rate 18, height 5\' 7"  (1.702 m), weight 63.549 kg (140 lb 1.6 oz), SpO2 93 %.  PHYSICAL EXAMINATION:  GENERAL:  81 y.o.-year-old patient lying in the bed with no acute distress.  LUNGS: Normal breath sounds bilaterally,  no wheezing, rales,rhonchi or crepitation. No use of accessory muscles of respiration.  CARDIOVASCULAR: S1, S2 normal. No murmurs, rubs, or gallops.  ABDOMEN: Soft, nontender, nondistended. Bowel sounds present. No organomegaly or mass. No guarding no rebound EXTREMITIES: No pedal edema, cyanosis, or clubbing.  NEUROLOGIC: Cranial nerves II through XII are intact. Muscle strength 5/5 in all extremities. Sensation intact. Gait not checked.  PSYCHIATRIC: The patient is alert and oriented to person, poor historian SKIN: No obvious rash, lesion, or ulcer.    LABORATORY PANEL:   CBC  Recent Labs Lab 12/19/14 0403  WBC 5.8  HGB 6.6*  HCT 20.4*  PLT 178   ------------------------------------------------------------------------------------------------------------------  Chemistries   Recent Labs Lab 12/14/14 1225  12/16/14 0838  NA 130*  < > 131*  K 3.1*  < > 3.5  CL 93*  < > 95*  CO2 29  < > 32  GLUCOSE 97  < > 82  BUN 18  < > 15  CREATININE 0.59  < > 0.54  CALCIUM 6.8*  < > 7.1*  AST 18  --   --   ALT <5*  --   --   ALKPHOS 105  --   --   BILITOT 0.6  --   --   < > = values in this interval not displayed. ------------------------------------------------------------------------------------------------------------------  Cardiac Enzymes  Recent Labs Lab 12/14/14 1657  TROPONINI 0.05*   ------------------------------------------------------------------------------------------------------------------  RADIOLOGY:  No results found.  EKG:   Orders placed or performed during the hospital encounter of 12/14/14  . EKG 12-Lead  . EKG 12-Lead  . EKG 12-Lead  .  EKG 12-Lead  . EKG 12-Lead  . EKG 12-Lead    ASSESSMENT AND PLAN:   Acute on chronic anemia probably from colon mass which is not obstructing  - Appreciate GI consultation: EGD 9/22  normal, status post colonoscopy 12/17/14-revealed likely malignant partially obstructing colon mass at the hepatic flexure,  status post biopsy, pathology results are pending - Pt will have surgery soon after bx results, appreciate sx recommendations - Given that she is a jehovah's witness and will not receive blood products ,hb at 6.6 today , will give iv venofer  also she has a St Judes  mechanical valve and is usually anticoagulated this is our window of opportunity for the studies. Holding coumadin as pt is at high risk for bleeding  INR is normal at this time - Continue Protonix -Appreciate cardio recommendations .  Pending palliative care consult   Urinary tract infection - Treated, UA is negative  Mechanical mitral valve - Appreciate cardiology consultation - INR is back to normal, will hold warfarin and heparin at this time - Resume Heparin and coumadin asap after surgery given the high risk for CVA and afib  Parkinson's - Continue Sinemet  Hypertension - Blood pressure low normal - Hold amlodipine  CODE STATUS: Full  TOTAL TIME TAKING CARE OF THIS PATIENT: 35 minutes.  Greater than 50% of time spent in care coordination and counseling. POSSIBLE D/C IN 1-2 DAYS, DEPENDING ON CLINICAL CONDITION.   Nicholes Mango M.D on 12/19/2014 at 10:46 PM  Between 7am to 6pm - Pager - [phone]  After 6pm go to [email or web address] - password [email or web address] [facility]  [facility]  Office  [phone]  CC: Primary care physician; Sherrin Daisy, MD

## 2014-12-19 NOTE — Progress Notes (Signed)
PT Cancellation Note  Patient Details Name: Gwendolyn Norman MRN: [id number] DOB: Aug 08, 1931   Cancelled Treatment:    Reason Eval/Treat Not Completed: Medical issues which prohibited therapy;Other (comment) (Hemoglobin level). Will check back at later time.   Dorice Lamas, PT, DPT 12/19/2014, 10:23 AM

## 2014-12-19 NOTE — Progress Notes (Signed)
CC: Anemia, weakness Subjective: 81 year old female with symptomatic anemia on the medicine service. Again had a long discussion about her colon mass and what surgery would entail. Patient voiced no complaints today. Denies any abdominal pain or any worsening fatigue.  Objective: Vital signs in last 24 hours: Temp:  [98.4 F (36.9 C)-99.3 F (37.4 C)] 99.3 F (37.4 C) (09/25 1125) Pulse Rate:  [82-87] 84 (09/25 1125) Resp:  [16-18] 16 (09/25 1125) BP: (107-110)/(49-58) 110/57 mmHg (09/25 1125) SpO2:  [93 %-98 %] 98 % (09/25 1125) Last BM Date: 12/18/14  Intake/Output from previous day: 09/24 0701 - 09/25 0700 In: 240 [P.O.:240] Out: 900 [Urine:900] Intake/Output this shift: Total I/O In: -  Out: 150 [Urine:150]  Physical exam:  Gen.: No acute distress  Chest: Clear to auscultation with mechanical heart valve Abdomen: Soft, nontender, nondistended.  Lab Results: CBC   Recent Labs  12/19/14 0403  WBC 5.8  HGB 6.6*  HCT 20.4*  PLT 178   BMET No results for input(s): NA, K, CL, CO2, GLUCOSE, BUN, CREATININE, CALCIUM in the last 72 hours. PT/INR No results for input(s): LABPROT, INR in the last 72 hours. ABG No results for input(s): PHART, HCO3 in the last 72 hours.  Invalid input(s): PCO2, PO2  Studies/Results: Ct Abdomen Pelvis W Contrast  12/17/2014   CLINICAL DATA:  Large mass near hepatic flexure? Unable to get scope beyond this site. Bx's taken. Recommend CT to localize mass/check for mets. Surgical referral since mass is nearly obstructing.  EXAM: CT ABDOMEN AND PELVIS WITH CONTRAST  TECHNIQUE: Multidetector CT imaging of the abdomen and pelvis was performed using the standard protocol following bolus administration of intravenous contrast.  CONTRAST:  87mL OMNIPAQUE IOHEXOL 300 MG/ML  SOLN  COMPARISON:  None applicable  FINDINGS: Lower chest: The heart is markedly enlarged. There is mitral annulus repair. The right atrium is markedly dilated. Left atrium is  also dilated. No pericardial effusion. Status post median sternotomy. Coronary artery calcifications are present. There are bilateral pleural effusions. Bibasilar atelectasis is present.  Upper abdomen: No focal abnormality identified within the liver, spleen, pancreas, or adrenal glands. The gallbladder is contracted.  Gastrointestinal tract: Stomach is distended with contrast. The proximal small bowel loops are normal in appearance. Distal small bowel loops are normal in appearance. The ascending colon has thickened wall. The mid transverse colon is involved by a large solid mass measuring 9.7 x 9.6 x 8.6 cm. The lumen of the colon is not discernible within this mass. Despite the presence of a large mass, there is little obstruction proximal to it.  Pelvis: The urinary bladder contains a small amount of air. Question of recent catheterization? The uterus is present. There is a small amount of free pelvic fluid. No adnexal mass.  Retroperitoneum: The abdominal aorta is tortuous and densely calcified. No aneurysm. Small portacaval lymph nodes are identified, largest measuring 1.1 cm.  Abdominal wall: There is diffuse body wall edema. Status post median sternotomy.  Osseous structures: No suspicious lytic or blastic lesions are identified. Scoliosis, convex right  IMPRESSION: 1. Large lobulated mass involving the mid transverse colon, measuring at least 9.7 cm. Ascending colon has a thickened wall. However there is little obstruction due to the mass. 2. No evidence for hepatic metastases. 3. Small portacaval lymph nodes. 4. Diffuse body wall edema. 5. Small amount of ascites. 6. Marked enlargement a hard with dilated right atrium and left atrium. 7. Previous mitral annulus repair. 8. Bilateral pleural effusions, right greater than left. 9.  Air within the urinary bladder. Question of recent catheterization. 10. Scoliosis and degenerative disc disease.   Electronically Signed   By: Nolon Nations M.D.   On:  12/17/2014 17:01    Anti-infectives: Anti-infectives    Start     Dose/Rate Route Frequency Ordered Stop   12/17/14 1445  levofloxacin (LEVAQUIN) tablet 250 mg     250 mg Oral Daily 12/17/14 1431     12/17/14 0930  ciprofloxacin (CIPRO) IVPB 400 mg     400 mg 200 mL/hr over 60 Minutes Intravenous  Once 12/17/14 0924 12/17/14 1116   12/16/14 1400  ciprofloxacin (CIPRO) IVPB 400 mg  Status:  Discontinued     400 mg 200 mL/hr over 60 Minutes Intravenous  Once 12/16/14 1347 12/16/14 1351   12/16/14 1100  ciprofloxacin (CIPRO) IVPB 400 mg     400 mg 200 mL/hr over 60 Minutes Intravenous  Once 12/16/14 1050 12/16/14 1217      Assessment/Plan:  81 year old female with a large abdominal mass. Again discussed with the patient that this may represent a large locally advanced colon cancer or another type of cancer growing into her colon. She continues to deny any signs or symptoms of obstruction from this mass. Discussed at length the risks of the surgery to include leading, damage to surrounding tissue, inability to obtain a cure, need for more procedures or prolonged hospital stay. Patient voiced that she would likely be interested in surgery but is currently still unsure as to how she wants to proceed. She seemed to indicate a desire for cure and for surgery however she waffled during our conversation. Discussed with the patient that my partner, Dr. Burt Knack, would be seeing her in the morning and again discussing surgical options versus planning. Anticipate at least a preliminary pathology result back tomorrow.  Discussed with primary team that starting patient on a short acting anticoagulant such as heparin would be appropriate at this time due to her mechanical heart valve. Once started we will need to check PT/INR prior to any surgical intervention.  Charles T. Adonis Huguenin, MD, FACS  12/19/2014

## 2014-12-20 DIAGNOSIS — R229 Localized swelling, mass and lump, unspecified: Secondary | ICD-10-CM

## 2014-12-20 DIAGNOSIS — IMO0002 Reserved for concepts with insufficient information to code with codable children: Secondary | ICD-10-CM | POA: Insufficient documentation

## 2014-12-20 DIAGNOSIS — D508 Other iron deficiency anemias: Secondary | ICD-10-CM

## 2014-12-20 DIAGNOSIS — K921 Melena: Secondary | ICD-10-CM

## 2014-12-20 LAB — CBC
HCT: 20.1 % — ABNORMAL LOW (ref 35.0–47.0)
HEMOGLOBIN: 6.5 g/dL — AB (ref 12.0–16.0)
MCH: 26.9 pg (ref 26.0–34.0)
MCHC: 32.4 g/dL (ref 32.0–36.0)
MCV: 82.9 fL (ref 80.0–100.0)
PLATELETS: 171 10*3/uL (ref 150–440)
RBC: 2.42 MIL/uL — AB (ref 3.80–5.20)
RDW: 19.8 % — ABNORMAL HIGH (ref 11.5–14.5)
WBC: 6.8 10*3/uL (ref 3.6–11.0)

## 2014-12-20 LAB — SURGICAL PATHOLOGY

## 2014-12-20 LAB — CEA: CEA: 3.5 ng/mL (ref 0.0–4.7)

## 2014-12-20 MED ORDER — LEVOFLOXACIN 250 MG PO TABS
250.0000 mg | ORAL_TABLET | Freq: Every day | ORAL | Status: AC
  Administered 2014-12-21: 250 mg via ORAL
  Filled 2014-12-20: qty 1

## 2014-12-20 MED ORDER — SODIUM CHLORIDE 0.9 % IV SOLN
100.0000 mg | Freq: Once | INTRAVENOUS | Status: AC
  Administered 2014-12-20: 100 mg via INTRAVENOUS
  Filled 2014-12-20: qty 5

## 2014-12-20 NOTE — Anesthesia Postprocedure Evaluation (Signed)
  Anesthesia Post-op Note  Patient: Gwendolyn Norman  Procedure(s) Performed: Procedure(s): ESOPHAGOGASTRODUODENOSCOPY (EGD) WITH PROPOFOL (N/A)  Anesthesia type:General  Patient location: PACU  Post pain: Pain level controlled  Post assessment: Post-op Vital signs reviewed, Patient's Cardiovascular Status Stable, Respiratory Function Stable, Patent Airway and No signs of Nausea or vomiting  Post vital signs: Reviewed and stable  Last Vitals:  Filed Vitals:   12/20/14 1143  BP: 100/41  Pulse: 79  Temp: 37.2 C  Resp: 20    Level of consciousness: awake, alert  and patient cooperative  Complications: No apparent anesthesia complications

## 2014-12-20 NOTE — Care Management Note (Signed)
Case Management Note  Patient Details  Name: Gwendolyn Norman MRN: [id number] Date of Birth: [birth year]  Subjective/Objective:   Gwendolyn Norman wants to discuss plans for surgery of bleeding colon mass with Dr Ma Hillock today before making any decisions about it. Hgb=6.5 today. 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 Case management will follow for discharge planning.                   Action/Plan:   Expected Discharge Date:                  Expected Discharge Plan:     In-House Referral:     Discharge planning Services     Post Acute Care Choice:    Choice offered to:     DME Arranged:    DME Agency:     HH Arranged:    [place] Agency:     Status of Service:     Medicare Important Message Given:  Yes-second notification given Date Medicare IM Given:    Medicare IM give by:    Date Additional Medicare IM Given:    Additional Medicare Important Message give by:     If discussed at [place] of Stay Meetings, dates discussed:    Additional Comments:  Rockett,Marilyn A, RN 12/20/2014, 9:00 AM

## 2014-12-20 NOTE — Progress Notes (Signed)
PT Cancellation Note  Patient Details Name: Gwendolyn Norman MRN: [id number] DOB: [birth year]   Cancelled Treatment:    Reason Eval/Treat Not Completed: Other (comment). Per discussion with Dr. Margaretmary Eddy, pt not appropriate for therapy this date. Will hold until further clearance obtained. Possible Sx pending for active bleed.    Ray,Stephanie 12/20/2014, 2:17 PM  Greggory Stallion, PT, DPT [phone]

## 2014-12-20 NOTE — Care Management Note (Signed)
Case Management Note  Patient Details  Name: AMARIYANA HEACOX MRN: [id number] Date of Birth: [birth year]  Subjective/Objective:        EDG completed.             Action/Plan:   Expected Discharge Date:                  Expected Discharge Plan:     In-House Referral:     Discharge planning Services     Post Acute Care Choice:    Choice offered to:     DME Arranged:    DME Agency:     HH Arranged:    [place] Agency:     Status of Service:     Medicare Important Message Given:  Yes-third notification given Date Medicare IM Given:    Medicare IM give by:    Date Additional Medicare IM Given:    Additional Medicare Important Message give by:     If discussed at [place] of Stay Meetings, dates discussed:    Additional Comments:  Rockett,Marilyn A, RN 12/20/2014, 3:57 PM

## 2014-12-20 NOTE — Progress Notes (Signed)
PT Cancellation Note  Patient Details Name: Gwendolyn Norman MRN: [id number] DOB: Oct 24, 1931   Cancelled Treatment:    Reason Eval/Treat Not Completed: Other (comment) (See PT note for further details) Secondary to pt chart review and pt hemoglobin at 6.5, PT will hold treatment this morning until further clarification from MD about appropriateness of therapy tasks. Will attempt at later time/date.   Janyth Contes 12/20/2014, 11:29 AM  Janyth Contes, SPT. [phone]

## 2014-12-20 NOTE — Progress Notes (Signed)
[facility] Encounter Note  Patient: Gwendolyn Norman / Admit Date: 12/14/2014 / Date of Encounter: 12/20/2014, 7:18 AM   Subjective: Weakness and fatigue and shortness of breath but no evidence of hemodynamic changes or cardiac symptoms Patient has a significantly lower hemoglobin at this time but no overt bleeding Heart rate controlled at this time Review of Systems: Positive for: Shortness of breath weakness Negative for: Vision change, hearing change, syncope, dizziness, nausea, vomiting,diarrhea, bloody stool, stomach pain, cough, congestion, diaphoresis, urinary frequency, urinary pain,skin lesions, skin rashes Others previously listed  Objective: Telemetry: Atrial fibrillation Physical Exam: Blood pressure 102/53, pulse 83, temperature 98.8 F (37.1 C), temperature source Oral, resp. rate 18, height 5\' 7"  (1.702 m), weight 140 lb 1.6 oz (63.549 kg), SpO2 94 %. Body mass index is 21.94 kg/(m^2). General: Well developed, well nourished, in no acute distress. Head: Normocephalic, atraumatic, sclera non-icteric, no xanthomas, nares are without discharge. Neck: No apparent masses Lungs: Normal respirations with no wheezes, few rhonchi, no rales , no crackles   Heart: Irregular rate and rhythm, mechanical S1 S2, apical murmur, no rub, no gallop, PMI is normal size and placement, carotid upstroke normal without bruit, jugular venous pressure normal Abdomen: Soft, non-tender, non-distended with normoactive bowel sounds. No hepatosplenomegaly. Abdominal aorta is normal size without bruit Extremities: 1+ edema, no clubbing, no cyanosis, no ulcers,  Peripheral: 2+ radial, 2+ femoral, 2+ dorsal pedal pulses Neuro: Alert and oriented. Moves all extremities spontaneously. Psych:  Responds to questions appropriately with a normal affect.   Intake/Output Summary (Last 24 hours) at 12/20/14 0718 Last data filed at 12/20/14 0406  Gross per 24 hour  Intake    340 ml   Output    650 ml  Net   -310 ml    Inpatient Medications:  . antiseptic oral rinse  7 mL Mouth Rinse BID  . calcium-vitamin D   Oral BID  . carbidopa-levodopa  2 tablet Oral TID  . ferrous sulfate  650 mg Oral BID  . folic acid  1 mg Oral Daily  . furosemide  20 mg Oral Daily  . levofloxacin  250 mg Oral Daily  . metoprolol  50 mg Oral BID  . pantoprazole  40 mg Oral Daily  . potassium chloride  10 mEq Oral Daily  . sodium chloride  3 mL Intravenous Q12H  . vitamin B-12  1,000 mcg Oral Daily   Infusions:    Labs: No results for input(s): NA, K, CL, CO2, GLUCOSE, BUN, CREATININE, CALCIUM, MG, PHOS in the last 72 hours. No results for input(s): AST, ALT, ALKPHOS, BILITOT, PROT, ALBUMIN in the last 72 hours.  Recent Labs  12/19/14 0403 12/20/14 0404  WBC 5.8 6.8  HGB 6.6* 6.5*  HCT 20.4* 20.1*  MCV 81.9 82.9  PLT 178 171   No results for input(s): CKTOTAL, CKMB, TROPONINI in the last 72 hours. Invalid input(s): POCBNP No results for input(s): HGBA1C in the last 72 hours.   Weights: Filed Weights   12/14/14 1222 12/14/14 1834  Weight: 130 lb (58.968 kg) 140 lb 1.6 oz (63.549 kg)     Radiology/Studies:  Dg Chest 2 View  12/14/2014   CLINICAL DATA:  81 year old female with 1 year history of cough.  EXAM: CHEST  2 VIEW  COMPARISON:  Prior chest x-ray 12/08/2014  FINDINGS: Unchanged massive enlargement of the cardiopericardial silhouette. Patient is status post median sternotomy. Atherosclerotic calcification present within the transverse aorta. No evidence of pulmonary edema. Small right pleural  effusion with associated right basilar atelectasis. No focal scratch then no acute osseous abnormality.  IMPRESSION: 1. Stable chest x-ray with persistent massive enlargement of the cardiopericardial silhouette which may reflect cardiomegaly and/or pericardial effusion. 2. Small right pleural effusion with associated right lower lobe atelectasis. 3. Negative for pulmonary edema.    Electronically Signed   By: Jacqulynn Cadet M.D.   On: 12/14/2014 20:09   Ct Abdomen Pelvis W Contrast  12/17/2014   CLINICAL DATA:  Large mass near hepatic flexure? Unable to get scope beyond this site. Bx's taken. Recommend CT to localize mass/check for mets. Surgical referral since mass is nearly obstructing.  EXAM: CT ABDOMEN AND PELVIS WITH CONTRAST  TECHNIQUE: Multidetector CT imaging of the abdomen and pelvis was performed using the standard protocol following bolus administration of intravenous contrast.  CONTRAST:  62mL OMNIPAQUE IOHEXOL 300 MG/ML  SOLN  COMPARISON:  None applicable  FINDINGS: Lower chest: The heart is markedly enlarged. There is mitral annulus repair. The right atrium is markedly dilated. Left atrium is also dilated. No pericardial effusion. Status post median sternotomy. Coronary artery calcifications are present. There are bilateral pleural effusions. Bibasilar atelectasis is present.  Upper abdomen: No focal abnormality identified within the liver, spleen, pancreas, or adrenal glands. The gallbladder is contracted.  Gastrointestinal tract: Stomach is distended with contrast. The proximal small bowel loops are normal in appearance. Distal small bowel loops are normal in appearance. The ascending colon has thickened wall. The mid transverse colon is involved by a large solid mass measuring 9.7 x 9.6 x 8.6 cm. The lumen of the colon is not discernible within this mass. Despite the presence of a large mass, there is little obstruction proximal to it.  Pelvis: The urinary bladder contains a small amount of air. Question of recent catheterization? The uterus is present. There is a small amount of free pelvic fluid. No adnexal mass.  Retroperitoneum: The abdominal aorta is tortuous and densely calcified. No aneurysm. Small portacaval lymph nodes are identified, largest measuring 1.1 cm.  Abdominal wall: There is diffuse body wall edema. Status post median sternotomy.  Osseous structures:  No suspicious lytic or blastic lesions are identified. Scoliosis, convex right  IMPRESSION: 1. Large lobulated mass involving the mid transverse colon, measuring at least 9.7 cm. Ascending colon has a thickened wall. However there is little obstruction due to the mass. 2. No evidence for hepatic metastases. 3. Small portacaval lymph nodes. 4. Diffuse body wall edema. 5. Small amount of ascites. 6. Marked enlargement a hard with dilated right atrium and left atrium. 7. Previous mitral annulus repair. 8. Bilateral pleural effusions, right greater than left. 9. Air within the urinary bladder. Question of recent catheterization. 10. Scoliosis and degenerative disc disease.   Electronically Signed   By: Nolon Nations M.D.   On: 12/17/2014 17:01   US Venous Img Lower Bilateral  12/14/2014   CLINICAL DATA:  Chronic bilateral lower extremity pain and swelling, discoloration.  EXAM: BILATERAL LOWER EXTREMITY VENOUS DOPPLER ULTRASOUND  TECHNIQUE: Gray-scale sonography with graded compression, as well as color Doppler and duplex ultrasound were performed to evaluate the lower extremity deep venous systems from the level of the common femoral vein and including the common femoral, femoral, profunda femoral, popliteal and calf veins including the posterior tibial, peroneal and gastrocnemius veins when visible. The superficial great saphenous vein was also interrogated. Spectral Doppler was utilized to evaluate flow at rest and with distal augmentation maneuvers in the common femoral, femoral and popliteal veins.  COMPARISON:  None.  FINDINGS: RIGHT LOWER EXTREMITY  Common Femoral Vein: No evidence of thrombus. Normal compressibility, respiratory phasicity and response to augmentation.  Saphenofemoral Junction: No evidence of thrombus. Normal compressibility and flow on color Doppler imaging.  Profunda Femoral Vein: No evidence of thrombus. Normal compressibility and flow on color Doppler imaging.  Femoral Vein: No evidence  of thrombus. Normal compressibility, respiratory phasicity and response to augmentation.  Popliteal Vein: No evidence of thrombus. Normal compressibility, respiratory phasicity and response to augmentation.  Calf Veins: No evidence of thrombus. Normal compressibility and flow on color Doppler imaging.  Superficial Great Saphenous Vein: No evidence of thrombus. Normal compressibility and flow on color Doppler imaging.  Venous Reflux:  None.  Other Findings: Right lower extremity subcutaneous peripheral edema noted. Right inguinal prominent lymph node measures 18 x 13 x 7 mm.  LEFT LOWER EXTREMITY  Common Femoral Vein: No evidence of thrombus. Normal compressibility, respiratory phasicity and response to augmentation.  Saphenofemoral Junction: No evidence of thrombus. Normal compressibility and flow on color Doppler imaging.  Profunda Femoral Vein: No evidence of thrombus. Normal compressibility and flow on color Doppler imaging.  Femoral Vein: No evidence of thrombus. Normal compressibility, respiratory phasicity and response to augmentation.  Popliteal Vein: No evidence of thrombus. Normal compressibility, respiratory phasicity and response to augmentation.  Calf Veins: No evidence of thrombus. Normal compressibility and flow on color Doppler imaging.  Superficial Great Saphenous Vein: No evidence of thrombus. Normal compressibility and flow on color Doppler imaging.  Venous Reflux:  None.  Other Findings: Similar left lower extremity subcutaneous edema noted.  IMPRESSION: Negative for significant DVT in either extremity.  Peripheral subcutaneous edema  Borderline right inguinal adenopathy   Electronically Signed   By: Jerilynn Mages.  Shick M.D.   On: 12/14/2014 16:46   Dg Chest Port 1 View  12/08/2014   CLINICAL DATA:  Three-week history of CHF.  EXAM: PORTABLE CHEST - 1 VIEW  COMPARISON:  10/04/2012  FINDINGS: The heart is enlarged but stable. There is tortuosity and calcification of the thoracic aorta. The lungs are  grossly clear. No edema or definite pleural effusion. Low lung volumes with vascular crowding and streaky basilar atelectasis. The bony thorax is intact.  IMPRESSION: Stable cardiac enlargement.  No acute pulmonary findings.   Electronically Signed   By: Marijo Sanes M.D.   On: 12/08/2014 15:51     Assessment and Recommendation  81 y.o. female with known mitral valve disease status post St. Jude mechanical mitral valve replacement in the remote past with atrial fibrillation now having significant GI bleed with severe anemia likely secondary to abdominal mass needing surgical intervention. The patient has had no evidence of true angina and/or heart failure at this time and is at lowest risk possible for surgical intervention. The patient is a Jehovah witness for which she will not take any blood transfusions and therefore will not be able to give packed red blood cells for significant anemia as well as has a high risk of concerns of bleeding prior to surgery if heparin is used. We have discussed at length with the patient that she does have multiple types of risks and after further discussion will not use anticoagulation prior to surgery due to concerns of worsening bleeding risk while eating to help with stroke risk and thrombosis risk of mitral valve as much as possible. 1. Proceed to surgery without restriction for significant source of bleeding and reduce of cancer 2. Resume heparin as soon as possible after surgery  for further risk reduction in stroke and thrombosis of mitral valve 3. Continue all appropriate measures for reduction of bleeding and further condition of anemia 4. No further cardiac diagnostics necessary at this time  Signed, Serafina Royals M.D. FACC

## 2014-12-20 NOTE — Progress Notes (Signed)
Palliative Care Update  Pt was in endo when I attempted to see her.  Obstructing colon cancer is known.  Pt has a mechanical heart valve and is a Jehovah's Witness.   I have reviewed Dr. Antionette Char note and see that he intends to discuss transfer to [facility] later this afternoon.  I feel it is best that I not talk with the family until after this conversations is initiated.  I will be available to talk with family after a decision is made about this transfer --especially in the event that no further treatment is desired.  She is full code and it is expected that she will opt for treatment --but no transfusions or blood products, since she is Jehovah's Witness.   Will await further information.  Kirby Funk, MD

## 2014-12-20 NOTE — Care Management Important Message (Signed)
Important Message  Patient Details  Name: Gwendolyn Norman MRN: [id number] Date of Birth: Jul 20, 1931   Medicare Important Message Given:  Yes-third notification given    Darius Bump Allmond 12/20/2014, 9:33 AM

## 2014-12-20 NOTE — Progress Notes (Signed)
[facility] [place] NAME: Gwendolyn Norman    MR#:  [id number]  DATE OF BIRTH:  Nov 24, 1931  SUBJECTIVE:  CHIEF COMPLAINT:   Chief Complaint  Patient presents with  . Abnormal Lab   Patient is resting comfortably.She might not consider surgery if she needs to be transferred to a tertiary center  REVIEW OF SYSTEMS:   Review of Systems  Constitutional: Negative for fever and chills.  HENT: Negative for congestion, hearing loss and tinnitus.   Eyes: Negative for double vision and photophobia.  Respiratory: Negative for shortness of breath.   Cardiovascular: Negative for chest pain and palpitations.  Gastrointestinal: Positive for constipation. Negative for nausea, vomiting and abdominal pain.  Genitourinary: Negative for dysuria.  Skin: Negative for itching and rash.  Neurological: Negative for headaches.  Psychiatric/Behavioral: The patient is not nervous/anxious and does not have insomnia.     DRUG ALLERGIES:   Allergies  Allergen Reactions  . Penicillins Shortness Of Breath and Other (See Comments)    Has patient had a PCN reaction causing immediate rash, facial/tongue/throat swelling, SOB or lightheadedness with hypotension: Yes Has patient had a PCN reaction causing severe rash involving mucus membranes or skin necrosis: No Has patient had a PCN reaction that required hospitalization No Has patient had a PCN reaction occurring within the last 10 years: No If all of the above answers are "NO", then may proceed with Cephalosporin use.   Marland Kitchen Doxycycline Rash    VITALS:  Blood pressure 111/58, pulse 84, temperature 98.7 F (37.1 C), temperature source Oral, resp. rate 20, height 5\' 7"  (1.702 m), weight 63.549 kg (140 lb 1.6 oz), SpO2 96 %.  PHYSICAL EXAMINATION:  GENERAL:  81 y.o.-year-old patient lying in the bed with no acute distress.  LUNGS: Normal breath sounds bilaterally, no wheezing, rales,rhonchi or crepitation.  No use of accessory muscles of respiration.  CARDIOVASCULAR: S1, S2 normal. No murmurs, rubs, or gallops.  ABDOMEN: Soft, nontender, nondistended. Bowel sounds present. No organomegaly or mass. No guarding no rebound EXTREMITIES: No pedal edema, cyanosis, or clubbing.  NEUROLOGIC: Cranial nerves II through XII are intact. Muscle strength 5/5 in all extremities. Sensation intact. Gait not checked.  PSYCHIATRIC: The patient is alert and oriented to person, poor historian SKIN: No obvious rash, lesion, or ulcer.    LABORATORY PANEL:   CBC  Recent Labs Lab 12/20/14 0404  WBC 6.8  HGB 6.5*  HCT 20.1*  PLT 171   ------------------------------------------------------------------------------------------------------------------  Chemistries   Recent Labs Lab 12/14/14 1225  12/16/14 0838  NA 130*  < > 131*  K 3.1*  < > 3.5  CL 93*  < > 95*  CO2 29  < > 32  GLUCOSE 97  < > 82  BUN 18  < > 15  CREATININE 0.59  < > 0.54  CALCIUM 6.8*  < > 7.1*  AST 18  --   --   ALT <5*  --   --   ALKPHOS 105  --   --   BILITOT 0.6  --   --   < > = values in this interval not displayed. ------------------------------------------------------------------------------------------------------------------  Cardiac Enzymes  Recent Labs Lab 12/14/14 1657  TROPONINI 0.05*   ------------------------------------------------------------------------------------------------------------------  RADIOLOGY:  No results found.  EKG:   Orders placed or performed during the hospital encounter of 12/14/14  . EKG 12-Lead  . EKG 12-Lead  . EKG 12-Lead  . EKG 12-Lead  . EKG 12-Lead  .  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 - Discussed this with the patient and her daughter today about transfering to [facility] and requested ph no, as recommended by dr.Cooper. Pt is not considering transfer to other hospitals regarding her surgery - Continue Protonix - palliative care consult is pending  2. Urinary tract infection - Treated, UA is negative  3. St. Judes Mechanical  mitral valve  - Appreciate cardiology consultation - holding off  warfarin at this time in anticipation of procedure,but if pt is not considering surgery or  transfer to tertiary care center  will resume heparin drip without bolus and coumadin as per cardiology recommendations as pt's at high risk for thrombus formation than bleeding from anticoagulation   4.Parkinson's - Continue Sinemet  5.Hypertension - Blood pressure is soft - Hold amlodipine  CODE STATUS: Full  TOTAL TIME TAKING CARE OF THIS PATIENT: 35 minutes.  Greater than 50% of time spent in care coordination and counseling. POSSIBLE D/C IN 1-2 DAYS, DEPENDING ON CLINICAL CONDITION.   Nicholes Mango M.D on 12/20/2014 at 8:35 PM  Between 7am to 6pm - Pager - [phone]  After 6pm go to [email or web address] - password [email or web address] [facility]  [facility]  Office  [phone]  CC: Primary care physician; Sherrin Daisy, MD

## 2014-12-20 NOTE — Progress Notes (Signed)
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 She has been identified with symptomatic anemia is likely secondary to a 9 cm mass probably emanating from the transverse colon. This may be in question due to the size and location of this mass however dimension its appearance on CT scan. Patient describes no pain today she's feeling well Denies nausea or vomiting and is passing gas.  Objective: Vital signs in last 24 hours: Temp:  [97.8 F (36.6 C)-99.3 F (37.4 C)] 97.8 F (36.6 C) (09/26 0727) Pulse Rate:  [83-87] 87 (09/26 0727) Resp:  [16-18] 17 (09/26 0727) BP: (102-110)/(52-65) 107/65 mmHg (09/26 0727) SpO2:  [93 %-98 %] 95 % (09/26 0727) Last BM Date: 12/18/14  Intake/Output from previous day: 09/25 0701 - 09/26 0700 In: 340 [P.O.:240; IV Piggyback:100] Out: 650 [Urine:650] Intake/Output this shift: Total I/O In: -  Out: 300 [Urine:300]  Physical exam:  Soft and minimally distended abdomen nontender Calves are nontender She is oriented and alert, and we members present.   Lab Results: CBC   Recent Labs  12/19/14 0403 12/20/14 0404  WBC 5.8 6.8  HGB 6.6* 6.5*  HCT 20.4* 20.1*  PLT 178 171   BMET No results for input(s): NA, K, CL, CO2, GLUCOSE, BUN, CREATININE, CALCIUM in the last 72 hours. PT/INR No results for input(s): LABPROT, INR in the last 72 hours. ABG No results for input(s): PHART, HCO3 in the last 72 hours.  Invalid input(s): PCO2, PO2  Studies/Results: No results found.  Anti-infectives: Anti-infectives    Start     Dose/Rate Route Frequency Ordered Stop   12/17/14 1445  levofloxacin (LEVAQUIN) tablet 250 mg     250 mg Oral Daily 12/17/14 1431     12/17/14 0930  ciprofloxacin (CIPRO) IVPB 400 mg     400 mg 200 mL/hr over 60 Minutes Intravenous  Once 12/17/14 0924 12/17/14 1116   12/16/14 1400  ciprofloxacin (CIPRO) IVPB 400 mg  Status:  Discontinued     400 mg 200  mL/hr over 60 Minutes Intravenous  Once 12/16/14 1347 12/16/14 1351   12/16/14 1100  ciprofloxacin (CIPRO) IVPB 400 mg     400 mg 200 mL/hr over 60 Minutes Intravenous  Once 12/16/14 1050 12/16/14 1217      Assessment/Plan:  Large mass in the abdomen possibly emanating from the colon but its unusual appearance does not suggest typical colon cancer. Pathology is currently pending from colonoscopy. Her CT scan is been personally reviewed as well.  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 I discussed with she and her daughter the potential for surgery and the different types of surgeries a could be performed although pathology is currently pending and that may change. I also discussed with them bloodless surgery and daughter reminded me that she has a relationship with the physicians at [facility] bloodless surgery clinic. I suggested that with the elective nature not to mention her need for anticoagulation due to her heart valve and her extreme risk with her marginal hemoglobin that transfer to [facility] bloodless surgery clinic for surgical intervention may be of value could be discussed. I will talk with them following the completion of the pathology review. In agreement with this.  I will also discuss this with the admitting physician and discuss options.  Florene Glen, MD, FACS  12/20/2014

## 2014-12-20 NOTE — Progress Notes (Signed)
I personally discussed the care of this patient with Dr. Gloris Ham and with the physical therapist. Apparently the physical therapist did not feel comfortable giving the patient up with her symptomatic anemia and a hematocrit of 20%.  I reviewed with Dr. Gloris Ham CT findings the pathology findings which I have reviewed as well as the patient's overall condition. The family is acquainted with an have an established relationship with the bloodless surgery clinic at [facility]. I have suggested that the patient be transferred to [facility] for continuation of care and consideration of surgical resection of this rather large 9 cm tumor that shows adenocarcinoma on pathology. I believe that the patient's risks are incredible for general anesthesia and bleeding and that she would be best served at a tertiary center for which she already has an established relationship. I will attempt to discuss this with the family later today.

## 2014-12-21 ENCOUNTER — Ambulatory Visit
Admit: 2014-12-21 | Discharge: 2014-12-21 | Disposition: A | Discharge status: Home / Self Care | Payer: Medicare Other | Attending: Radiation Oncology | Admitting: Radiation Oncology

## 2014-12-21 DIAGNOSIS — C183 Malignant neoplasm of hepatic flexure: Secondary | ICD-10-CM

## 2014-12-21 DIAGNOSIS — E43 Unspecified severe protein-calorie malnutrition: Secondary | ICD-10-CM | POA: Insufficient documentation

## 2014-12-21 DIAGNOSIS — G20 Parkinson's disease: Secondary | ICD-10-CM

## 2014-12-21 DIAGNOSIS — C189 Malignant neoplasm of colon, unspecified: Principal | ICD-10-CM

## 2014-12-21 DIAGNOSIS — I10 Essential (primary) hypertension: Secondary | ICD-10-CM

## 2014-12-21 DIAGNOSIS — D649 Anemia, unspecified: Secondary | ICD-10-CM

## 2014-12-21 DIAGNOSIS — I4891 Unspecified atrial fibrillation: Secondary | ICD-10-CM

## 2014-12-21 DIAGNOSIS — Z515 Encounter for palliative care: Secondary | ICD-10-CM

## 2014-12-21 DIAGNOSIS — Z7901 Long term (current) use of anticoagulants: Secondary | ICD-10-CM

## 2014-12-21 DIAGNOSIS — I509 Heart failure, unspecified: Secondary | ICD-10-CM

## 2014-12-21 DIAGNOSIS — Z87820 Personal history of traumatic brain injury: Secondary | ICD-10-CM

## 2014-12-21 DIAGNOSIS — Z79899 Other long term (current) drug therapy: Secondary | ICD-10-CM

## 2014-12-21 LAB — CBC
HEMATOCRIT: 21.2 % — AB (ref 35.0–47.0)
Hemoglobin: 6.9 g/dL — ABNORMAL LOW (ref 12.0–16.0)
MCH: 27 pg (ref 26.0–34.0)
MCHC: 32.6 g/dL (ref 32.0–36.0)
MCV: 82.8 fL (ref 80.0–100.0)
Platelets: 202 10*3/uL (ref 150–440)
RBC: 2.56 MIL/uL — ABNORMAL LOW (ref 3.80–5.20)
RDW: 20.3 % — AB (ref 11.5–14.5)
WBC: 8 10*3/uL (ref 3.6–11.0)

## 2014-12-21 LAB — PROTIME-INR
INR: 1.5
Prothrombin Time: 18.3 seconds — ABNORMAL HIGH (ref 11.4–15.0)

## 2014-12-21 LAB — APTT: aPTT: 40 seconds — ABNORMAL HIGH (ref 24–36)

## 2014-12-21 MED ORDER — ENSURE ENLIVE PO LIQD
237.0000 mL | Freq: Two times a day (BID) | ORAL | Status: DC
  Administered 2014-12-22: 237 mL via ORAL

## 2014-12-21 MED ORDER — FERROUS SULFATE 325 (65 FE) MG PO TABS
650.0000 mg | ORAL_TABLET | Freq: Every day | ORAL | Status: DC
  Administered 2014-12-22: 650 mg via ORAL
  Filled 2014-12-21: qty 2

## 2014-12-21 MED ORDER — WARFARIN - PHARMACIST DOSING INPATIENT
Freq: Every day | Status: DC
  Administered 2014-12-21: 18:00:00

## 2014-12-21 MED ORDER — ENOXAPARIN SODIUM 80 MG/0.8ML ~~LOC~~ SOLN
1.0000 mg/kg | Freq: Once | SUBCUTANEOUS | Status: AC
  Administered 2014-12-21: 65 mg via SUBCUTANEOUS
  Filled 2014-12-21: qty 0.8

## 2014-12-21 MED ORDER — SODIUM CHLORIDE 0.9 % IV SOLN
100.0000 mg | Freq: Once | INTRAVENOUS | Status: AC
  Administered 2014-12-21: 100 mg via INTRAVENOUS
  Filled 2014-12-21: qty 5

## 2014-12-21 MED ORDER — HEPARIN (PORCINE) IN NACL 100-0.45 UNIT/ML-% IJ SOLN
950.0000 [IU]/h | INTRAMUSCULAR | Status: DC
  Administered 2014-12-21: 950 [IU]/h via INTRAVENOUS
  Filled 2014-12-21 (×2): qty 250

## 2014-12-21 MED ORDER — WARFARIN SODIUM 4 MG PO TABS
2.0000 mg | ORAL_TABLET | Freq: Every day | ORAL | Status: DC
  Administered 2014-12-21: 2 mg via ORAL
  Filled 2014-12-21: qty 1

## 2014-12-21 MED ORDER — ENOXAPARIN SODIUM 80 MG/0.8ML ~~LOC~~ SOLN
1.0000 mg/kg | Freq: Two times a day (BID) | SUBCUTANEOUS | Status: DC
  Administered 2014-12-22: 65 mg via SUBCUTANEOUS
  Filled 2014-12-21: qty 0.8

## 2014-12-21 NOTE — Progress Notes (Signed)
Initial Nutrition Assessment  DOCUMENTATION CODES:   Severe malnutrition in context of chronic illness  INTERVENTION:   Meals and Snacks: Cater to patient preferences, will send bed time snack Medical Food Supplement Therapy: Ensure Enlive po BID, each supplement provides 350 kcal and 20 grams of protein  NUTRITION DIAGNOSIS:   Malnutrition related to chronic illness as evidenced by severe depletion of body fat, severe depletion of muscle mass.  GOAL:   Patient will meet greater than or equal to 90% of their needs  MONITOR:    (Energy Intake, Anthropometrics, Electrolyte/Renal Profile, Glucose Profile)  REASON FOR ASSESSMENT:   LOS    ASSESSMENT:    Pt admitted with acute on chronic anemia, mass in transverse colon, palliative care following, per MD note, pt refused all surgical options including transfer to tertiary care  Past Medical History  Diagnosis Date  . CHF (congestive heart failure)     diastolic dysfunction  . Parkinson disease   . Hypertension   . Osteoarthritis   . Rheumatic heart disease   . H/O mitral valve replacement with mechanical valve   . GERD (gastroesophageal reflux disease)   . CVA (cerebral infarction)     No residual neuro deficits  . Atrial fibrillation   . Pulmonary hypertension      Diet Order:  Diet 2 gram sodium Room service appropriate?: Yes; Fluid consistency:: Thin   Energy Intake: pt ate 1/2 [place] sandwich, bites of soup for lunch today. Family reports this is indicative of po intake, they have not been ordering a lot on meal trays because pt cannot eat much at one time. Recorded po intake 77% of meals  Food and Nutrition related history: pt reports good appetite prior to admission  Electrolyte and Renal Profile:  Recent Labs Lab 12/15/14 0647 12/16/14 0838  BUN 14 15  CREATININE 0.51 0.54  NA 131* 131*  K 3.3* 3.5   Glucose Profile: No results for input(s): GLUCAP in the last 72 hours.   Meds: lasix, folic acid,  iron  Nutrition Focused Physical Exam: Nutrition-Focused physical exam completed. Findings are mild to severe fat depletion, mild to severe muscle depletion, and mild edema.    Height:   Ht Readings from Last 1 Encounters:  12/14/14 5\' 7"  (1.702 m)    Weight: reports weight loss since being in the hospital but attributes some of this to fluid,reports UBW 150 pounds; 6.7% wt loss   Wt Readings from Last 1 Encounters:  12/14/14 140 lb 1.6 oz (63.549 kg)   Filed Weights   12/14/14 1222 12/14/14 1834  Weight: 130 lb (58.968 kg) 140 lb 1.6 oz (63.549 kg)    BMI:  Body mass index is 21.94 kg/(m^2).  Estimated Nutritional Needs:   Kcal:  2585-2778 kcals   Protein:  70-83 g (1.1-1.3 g/kg)   Fluid:  1587-1905 mL (25-30 ml/kg)    HIGH Care Level  Kerman Passey MS, RD, LDN [phone] Pager

## 2014-12-21 NOTE — Progress Notes (Signed)
Heparin gtt started at 9.68ml/hr per md orders.  Tolerating well, verified with Crystal M. RN

## 2014-12-21 NOTE — Care Management (Signed)
Patient has verbalized that she does not wish to pursue any surgical intervention.  It is not anticipated patient will discharge to [facility] and have palliative care follow.

## 2014-12-21 NOTE — Progress Notes (Signed)
MD, Dr. Lavetta Nielsen ordered to hold pm dose of Metoprolol due to BP low, 102/51.  Will continue to monitor. Jessee Avers

## 2014-12-21 NOTE — Progress Notes (Signed)
Palliative Care Update  Please see my brief note from yesterday afternoon (12/20/14).  As of that time, I opted to wait until the surgeon comes back to talk with pt/ family about transferring pt out.  I have since learned that the surgeon presented this to pt last night and that this am, she has refused this transfer and all surgical options. She agreed to DNR status in a talk with Gwendolyn Norman.    I am going to talk to pt and daughter now about options for a more palliative approach and possibly even Hospice (she is not yet Hospice Home appropriate as she is not imminently dying).    Full note to follow after I see pt and daughter.  Gwendolyn Funk, MD

## 2014-12-21 NOTE — Progress Notes (Signed)
[facility] NAME: Gwendolyn Norman    MR#:  [id number]  DATE OF BIRTH:  [birth year]  SUBJECTIVE:  CHIEF COMPLAINT:   Chief Complaint  Patient presents with  . Abnormal Lab   Patient is resting comfortably.She is not considering surgery and doesn't want to be transferred to  tertiary care center. She is aware of the biopsy results and wants to be DO NOT RESUSCITATE. Reports intermittent episodes of abdominal pain  REVIEW OF SYSTEMS:   Review of Systems  Constitutional: Negative for fever and chills.  HENT: Negative for congestion, hearing loss and tinnitus.   Eyes: Negative for double vision and photophobia.  Respiratory: Negative for shortness of breath.   Cardiovascular: Negative for chest pain and palpitations.  Gastrointestinal: Positive for abdominal pain and constipation. Negative for nausea and vomiting.  Genitourinary: Negative for dysuria.  Skin: Negative for itching and rash.  Neurological: Negative for headaches.  Psychiatric/Behavioral: The patient is not nervous/anxious and does not have insomnia.     DRUG ALLERGIES:   Allergies  Allergen Reactions  . Penicillins Shortness Of Breath and Other (See Comments)    Has patient had a PCN reaction causing immediate rash, facial/tongue/throat swelling, SOB or lightheadedness with hypotension: Yes Has patient had a PCN reaction causing severe rash involving mucus membranes or skin necrosis: No Has patient had a PCN reaction that required hospitalization No Has patient had a PCN reaction occurring within the last 10 years: No If all of the above answers are "NO", then may proceed with Cephalosporin use.   Marland Kitchen Doxycycline Rash    VITALS:  Blood pressure 110/52, pulse 82, temperature 98 F (36.7 C), temperature source Oral, resp. rate 20, height 5\' 7"  (1.702 m), weight 63.549 kg (140 lb 1.6 oz), SpO2 95 %.  PHYSICAL EXAMINATION:  GENERAL:  81 y.o.-year-old  patient lying in the bed with no acute distress.  LUNGS: Normal breath sounds bilaterally, no wheezing, rales,rhonchi or crepitation. No use of accessory muscles of respiration.  CARDIOVASCULAR: S1, S2 normal. No murmurs, rubs, or gallops.  ABDOMEN: Soft, nontender, nondistended. Bowel sounds present. No organomegaly or mass. No guarding no rebound EXTREMITIES: No pedal edema, cyanosis, or clubbing.  NEUROLOGIC: Cranial nerves II through XII are intact. Muscle strength 5/5 in all extremities. Sensation intact. Gait not checked.  PSYCHIATRIC: The patient is alert and oriented to person, poor historian SKIN: No obvious rash, lesion, or ulcer.    LABORATORY PANEL:   CBC  Recent Labs Lab 12/21/14 1153  WBC 8.0  HGB 6.9*  HCT 21.2*  PLT 202   ------------------------------------------------------------------------------------------------------------------  Chemistries   Recent Labs Lab 12/16/14 0838  NA 131*  K 3.5  CL 95*  CO2 32  GLUCOSE 82  BUN 15  CREATININE 0.54  CALCIUM 7.1*   ------------------------------------------------------------------------------------------------------------------  Cardiac Enzymes  Recent Labs Lab 12/14/14 1657  TROPONINI 0.05*   ------------------------------------------------------------------------------------------------------------------  RADIOLOGY:  No results found.  EKG:   Orders placed or performed during the hospital encounter of 12/14/14  . EKG 12-Lead  . EKG 12-Lead  . EKG 12-Lead  . EKG 12-Lead  . EKG 12-Lead  . 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 Patient is aware of the risk of bleeding with anticoagulants including heparin versus Lovenox and Coumadin. Agreeable with antocoagulation at this point after understanding the risks and benefits - Patient is started on Lovenox 1 mg/kg subcutaneous for bridging as well as Coumadin - Continue Protonix - Dr. Megan Salon will discuss with the patient and her daughter regarding hospice care.appreciate palliative care recommendations,  Urinary tract infection - Treated, UA is negative  3. St. Judes Mechanical  mitral valve  - Appreciate cardiology consultation - resume Coumadin with Lovenox bridging in the interim   4.Parkinson's - Continue Sinemet  5.Hypertension - Blood pressure is soft - Hold amlodipine  6.generalized weakness   PT consult is pending   Disposition probably to skilled nursing care with hospice  CODE STATUS: DO NOT RESUSCITATE TOTAL TIME TAKING CARE OF THIS PATIENT: 35 minutes. Plan of care discussed in detail with the patient, her daughter in detail and they verbalized understanding of the plan This was also discussed with Dr. Megan Salon, RN Lavella Lemons  Greater than 50% of time spent in care coordination and counseling. POSSIBLE D/C IN 1-2 DAYS, DEPENDING ON CLINICAL CONDITION.   Nicholes Mango M.D on 12/21/2014 at 1:45 PM  Between 7am to 6pm - Pager - [phone]  After 6pm go to [email or web address] - password [email or web address] [facility]  [facility]  Office  [phone]  CC: Primary care physician; Sherrin Daisy, MD

## 2014-12-21 NOTE — Progress Notes (Signed)
PT Cancellation Note  Patient Details Name: Gwendolyn Norman MRN: [id number] DOB: March 24, 1932   Cancelled Treatment:    Reason Eval/Treat Not Completed: Other (comment) (See PT note for further details) Secondary to continued low hemoglobin as well as pending palliative care consult, PT will hold pt this morning. Will attempt at later time/date if appropriate for this patient.    Janyth Contes 12/21/2014, 11:20 AM  Janyth Contes, SPT. [phone]

## 2014-12-21 NOTE — Progress Notes (Signed)
Physical Therapy Treatment Patient Details Name: Gwendolyn Norman MRN: [id number] DOB: Jul 31, 1931 Today's Date: 12/21/2014    History of Present Illness Patient is a pleasant 81 y/o female that presents with possible GI complications and has been noted to have low hemoglobin since this admission. She was recently diagnosed with Parkinson's and has been participating with HHPT recently. Pt recently diagnosed with actively bleeding colon cancer. Due to religious beliefs, pt refusing blood transfusions even though she has low hemoglobin.      PT Comments    Per MD, pt cleared for minor mobility such as transfer to/from chair during PT evaluation regardless of current hemoglobin levels. Pt is progressing towards goals with transfer to chair with assistance from therapist. Pt shows that she is willing to participate and grow in therapy tasks, and is very pleasant to work with. Due to her decreased strength, endurance, and gait deficits (possibly from Parkinson's), pt will continue to benefit from skilled PT in order for her to eventually return home safely.   Follow Up Recommendations  SNF     Equipment Recommendations       Recommendations for Other Services       Precautions / Restrictions Precautions Precautions: Fall (Hgb 6.5) Restrictions Weight Bearing Restrictions: No    Mobility  Bed Mobility Overal bed mobility: Needs Assistance Bed Mobility: Supine to Sit     Supine to sit: Mod assist     General bed mobility comments: Pt requires assist for her trunk and LEs in order to get to EOB. Pt in NAD during any bed mobility  Transfers Overall transfer level: Needs assistance Equipment used: Rolling walker (2 wheeled) Transfers: Sit to/from Stand Sit to Stand: Mod assist;Max assist         General transfer comment: Pt requires heavy assist to get into standing, especially with posterior lean. She needs cues for hand placement and to lean forward.    Ambulation/Gait Ambulation/Gait assistance: Mod assist Ambulation Distance (Feet): 3 Feet Assistive device: Rolling walker (2 wheeled) Gait Pattern/deviations: Step-to pattern;Decreased step length - right;Decreased step length - left;Decreased dorsiflexion - right;Decreased dorsiflexion - left;Decreased weight shift to right;Decreased weight shift to left Gait velocity: greatly decreased Gait velocity interpretation: <1.8 ft/sec, indicative of risk for recurrent falls General Gait Details: Pt requires weight shifting from therapist in order to clear each foot to take a step. She also needs heavy trunk support as well as cues for sequencing of RW. Pt happy to participate and practice ambulation   Stairs            Wheelchair Mobility    Modified Rankin (Stroke Patients Only)       Balance Overall balance assessment: Needs assistance Sitting-balance support: Bilateral upper extremity supported Sitting balance-Leahy Scale: Fair     Standing balance support: Bilateral upper extremity supported Standing balance-Leahy Scale: Poor Standing balance comment: Pt will fall backwards without assist from therapist                    Cognition Arousal/Alertness: Awake/alert Behavior During Therapy: WFL for tasks assessed/performed Overall Cognitive Status: Within Functional Limits for tasks assessed                      Exercises      General Comments        Pertinent Vitals/Pain Pain Assessment: No/denies pain    Home Living  Prior Function            PT Goals (current goals can now be found in the care plan section) Acute Rehab PT Goals Patient Stated Goal: to get to the chair PT Goal Formulation: With patient Time For Goal Achievement: 12/30/14 Potential to Achieve Goals: Good Progress towards PT goals: Progressing toward goals    Frequency  Min 2X/week    PT Plan Current plan remains appropriate     Co-evaluation             End of Session Equipment Utilized During Treatment: Gait belt Activity Tolerance: Patient tolerated treatment well Patient left: in chair;with call bell/phone within reach;with chair alarm set     Time: 9233-0076 PT Time Calculation (min) (ACUTE ONLY): 25 min  Charges:                       G CodesJanyth Contes 2015/01/10, 3:56 PM  Janyth Contes, SPT. [phone]

## 2014-12-21 NOTE — Consult Note (Signed)
Except an outstanding is perfect of Radiation Oncology NEW PATIENT EVALUATION  Name: Gwendolyn Norman  MRN: [id number]  Date:   12/21/2014     DOB: 1932-03-08   This 81 y.o. female patient presents to the clinic for initial evaluation of palliative treatment for transverse colon adenocarcinoma.  REFERRING PHYSICIAN: Sherrin Daisy, MD  CHIEF COMPLAINT: No chief complaint on file.   DIAGNOSIS: There were no encounter diagnoses.   PREVIOUS INVESTIGATIONS:  CT scan reviewed Clinical notes reviewed Pathology report reviewed  HPI: Patient is a 81 year old [place] who is been followed by medical oncology for several years for iron deficiency anemia. She recently presented to the emergency room with increasing weakness hemoglobin of 7.7. She's having dark stools. CT scan demonstrated a large suspicious mass in the transverse colon which was confirmed on colonoscopy. At the time of colonoscopy was a large malignant partially obstructed tumor at the hepatic flexure. Biopsy was positive for adenocarcinoma. Based on her religious believe she has refused surgery as well as blood products. She has multiple comorbidities including a prostatic mitral valve history of congestive heart failure and atrial fibrillation and pulmonary hypertension. I discussed the case with medical oncology she is now referred for opinion regarding palliative radiation to try to prevent further bleeding. She is seen today in her hospital bed she is doing fairly well although is weak.  PLANNED TREATMENT REGIMEN: Palliative radiation therapy to transverse colon adenocarcinoma  PAST MEDICAL HISTORY:  has a past medical history of CHF (congestive heart failure); Parkinson disease; Hypertension; Osteoarthritis; Rheumatic heart disease; H/O mitral valve replacement with mechanical valve; GERD (gastroesophageal reflux disease); CVA (cerebral infarction); Atrial fibrillation; and Pulmonary hypertension.    PAST SURGICAL  HISTORY:  Past Surgical History  Procedure Laterality Date  . Mitral valve replacement    . Esophagogastroduodenoscopy (egd) with propofol N/A 12/16/2014    Procedure: ESOPHAGOGASTRODUODENOSCOPY (EGD) WITH PROPOFOL;  Surgeon: Hulen Luster, MD;  Location: [facility] ENDOSCOPY;  Service: Gastroenterology;  Laterality: N/A;  . Colonoscopy N/A 12/17/2014    Procedure: COLONOSCOPY;  Surgeon: Hulen Luster, MD;  Location: [facility] ENDOSCOPY;  Service: Endoscopy;  Laterality: N/A;    FAMILY HISTORY: family history includes CAD in her father; Cancer in her mother.  SOCIAL HISTORY:  reports that she has never smoked. She has never used smokeless tobacco. She reports that she does not drink alcohol or use illicit drugs.  ALLERGIES: Penicillins and Doxycycline  MEDICATIONS:  No current facility-administered medications for this encounter.   No current outpatient prescriptions on file.   Facility-Administered Medications Ordered in Other Encounters  Medication Dose Route Frequency Provider Last Rate Last Dose  . acetaminophen (TYLENOL) tablet 500-1,000 mg  500-1,000 mg Oral Q6H PRN Gladstone Lighter, MD      . antiseptic oral rinse (CPC / CETYLPYRIDINIUM CHLORIDE 0.05%) solution 7 mL  7 mL Mouth Rinse BID Aldean Jewett, MD   7 mL at 12/21/14 0830  . carbidopa-levodopa (SINEMET IR) 25-100 MG per tablet immediate release 2 tablet  2 tablet Oral TID Gladstone Lighter, MD   2 tablet at 12/21/14 0920  . enoxaparin (LOVENOX) injection 65 mg  1 mg/kg Subcutaneous Once Nicholes Mango, MD      . Derrill Memo ON 12/22/2014] enoxaparin (LOVENOX) injection 65 mg  1 mg/kg Subcutaneous Q12H Aruna Gouru, MD      . feeding supplement (ENSURE ENLIVE) (ENSURE ENLIVE) liquid 237 mL  237 mL Oral BID BM Nicholes Mango, MD      . Derrill Memo ON 12/22/2014]  ferrous sulfate tablet 650 mg  650 mg Oral Daily Colleen Can, MD      . folic acid (FOLVITE) tablet 1 mg  1 mg Oral Daily Gladstone Lighter, MD   1 mg at 12/21/14 0920  . furosemide (LASIX)  tablet 20 mg  20 mg Oral Daily Gladstone Lighter, MD   20 mg at 12/21/14 0920  . levofloxacin (LEVAQUIN) tablet 250 mg  250 mg Oral Daily Nicholes Mango, MD   250 mg at 12/21/14 8546  . metoprolol (LOPRESSOR) tablet 50 mg  50 mg Oral BID Gladstone Lighter, MD   50 mg at 12/21/14 0920  . pantoprazole (PROTONIX) EC tablet 40 mg  40 mg Oral Daily Gladstone Lighter, MD   40 mg at 12/21/14 0920  . potassium chloride SA (K-DUR,KLOR-CON) CR tablet 10 mEq  10 mEq Oral Daily Gladstone Lighter, MD   10 mEq at 12/21/14 0920  . senna-docusate (Senokot-S) tablet 1 tablet  1 tablet Oral QHS PRN Gladstone Lighter, MD      . sodium chloride 0.9 % injection 3 mL  3 mL Intravenous Q12H Gladstone Lighter, MD   3 mL at 12/21/14 0920  . vitamin B-12 (CYANOCOBALAMIN) tablet 1,000 mcg  1,000 mcg Oral Daily Gladstone Lighter, MD   1,000 mcg at 12/21/14 0920  . warfarin (COUMADIN) tablet 2 mg  2 mg Oral q1800 Crystal G Scarpena, RPH      . Warfarin - Pharmacist Dosing Inpatient   Does not apply q1800 Crystal G Scarpena, RPH        ECOG PERFORMANCE STATUS:  2 - Symptomatic, <50% confined to bed  REVIEW OF SYSTEMS: Aside from her weakness dark stools Patient denies any weight loss, fatigue, weakness, fever, chills or night sweats. Patient denies any loss of vision, blurred vision. Patient denies any ringing  of the ears or hearing loss. No irregular heartbeat. Patient denies heart murmur or history of fainting. Patient denies any chest pain or pain radiating to her upper extremities. Patient denies any shortness of breath, difficulty breathing at night, cough or hemoptysis. Patient denies any swelling in the lower legs. Patient denies any nausea vomiting, vomiting of blood, or coffee ground material in the vomitus. Patient denies any stomach pain. Patient states has had normal bowel movements no significant constipation or diarrhea. Patient denies any dysuria, hematuria or significant nocturia. Patient denies any problems walking,  swelling in the joints or loss of balance. Patient denies any skin changes, loss of hair or loss of weight. Patient denies any excessive worrying or anxiety or significant depression. Patient denies any problems with insomnia. Patient denies excessive thirst, polyuria, polydipsia. Patient denies any swollen glands, patient denies easy bruising or easy bleeding. Patient denies any recent infections, allergies or URI. Patient "s visual fields have not changed significantly in recent time.    PHYSICAL EXAM: There were no vitals taken for this visit. Well-developed elderly female seen in her bed in the hospital. Lungs are clear to A&P. Abdomen is protuberant with decreased bowel sounds in the upper quadrants. Well-developed well-nourished patient in NAD. HEENT reveals PERLA, EOMI, discs not visualized.  Oral cavity is clear. No oral mucosal lesions are identified. Neck is clear without evidence of cervical or supraclavicular adenopathy. Lungs are clear to A&P. Cardiac examination is essentially unremarkable with regular rate and rhythm without murmur rub or thrill. Abdomen is benign with no organomegaly or masses noted. Motor sensory and DTR levels are equal and symmetric in the upper and lower extremities. Cranial nerves  II through XII are grossly intact. Proprioception is intact. No peripheral adenopathy or edema is identified. No motor or sensory levels are noted. Crude visual fields are within normal range. LABORATORY DATA: Surgical pathology report reviewed    RADIOLOGY RESULTS: CT scan reviewed   IMPRESSION: Large transverse colon adenocarcinoma causing significant anemia in Jehovah's Witness with multiple medical: And 80s for palliative radiation therapy  PLAN: At this time I like to try palliative radiation therapy to this large transverse colon mass. Would plan on delivering 4000 cGy over 4 weeks in evaluating for response. Risks and benefits of treatment including fatigue possible nausea  possible diarrhea possible alteration of blood counts and skin reaction all were discussed in detail with the patient. I have set up and ordered CT simulation for later this week. I've discussed the case personally with medical oncology.  I would like to take this opportunity for allowing me to participate in the care of your patient.Armstead Peaks., MD

## 2014-12-21 NOTE — Progress Notes (Addendum)
ANTICOAGULATION CONSULT NOTE - Initial Consult  Pharmacy Consult for Heparin and warfarin dosing Indication: mechanical mitral valve replacemtn/history of atrial fibrillation  Allergies  Allergen Reactions  . Penicillins Shortness Of Breath and Other (See Comments)    Has patient had a PCN reaction causing immediate rash, facial/tongue/throat swelling, SOB or lightheadedness with hypotension: Yes Has patient had a PCN reaction causing severe rash involving mucus membranes or skin necrosis: No Has patient had a PCN reaction that required hospitalization No Has patient had a PCN reaction occurring within the last 10 years: No If all of the above answers are "NO", then may proceed with Cephalosporin use.   Marland Kitchen Doxycycline Rash    Patient Measurements: Height: 5\' 7"  (170.2 cm) Weight: 140 lb 1.6 oz (63.549 kg) IBW/kg (Calculated) : 61.6 Heparin Dosing Weight:63.5 kg  Vital Signs: Temp: 97.9 F (36.6 C) (09/27 0747) Temp Source: Oral (09/27 0747) BP: 107/59 mmHg (09/27 0747) Pulse Rate: 81 (09/27 0747)  Labs:  Recent Labs  12/19/14 0403 12/20/14 0404  HGB 6.6* 6.5*  HCT 20.4* 20.1*  PLT 178 171    Estimated Creatinine Clearance: 54.5 mL/min (by C-G formula based on Cr of 0.54).   Medical History: Past Medical History  Diagnosis Date  . CHF (congestive heart failure)     diastolic dysfunction  . Parkinson disease   . Hypertension   . Osteoarthritis   . Rheumatic heart disease   . H/O mitral valve replacement with mechanical valve   . GERD (gastroesophageal reflux disease)   . CVA (cerebral infarction)     No residual neuro deficits  . Atrial fibrillation   . Pulmonary hypertension     Medications:  Scheduled:  . antiseptic oral rinse  7 mL Mouth Rinse BID  . calcium-vitamin D   Oral BID  . carbidopa-levodopa  2 tablet Oral TID  . ferrous sulfate  650 mg Oral BID  . folic acid  1 mg Oral Daily  . furosemide  20 mg Oral Daily  . levofloxacin  250 mg Oral  Daily  . metoprolol  50 mg Oral BID  . pantoprazole  40 mg Oral Daily  . potassium chloride  10 mEq Oral Daily  . sodium chloride  3 mL Intravenous Q12H  . vitamin B-12  1,000 mcg Oral Daily  . warfarin  2 mg Oral q1800  . Warfarin - Pharmacist Dosing Inpatient   Does not apply q1800   Infusions:  . heparin      Assessment: Pharmacy consulted to initiate heparin drip with no bolus and warfarin therapy in an 81 yo female with history of atrial fibrillation and mechanical mitral valve.  PTA warfarin dosing of warfarin 2 mg every except 3 mg on Mon, Tues, Wed.  INR on admission of 6.61.  Patient on antibiotic therapy with macrobid at this time.   Patient's anticoagulation withheld for possible surgery. Per MD, no surgery at this time.    Baseline labs pending (aptt, INR)  Goal of Therapy:  INR goal 2.5-3.5 Heparin level 0.3-0.7 units/ml Monitor platelets by anticoagulation protocol: Yes   Plan:  Per MD, give no bolus.  Start heparin drip at 950 units/hr and recheck Heparin level in 8 hours after drip initiation.    Will order warfarin 2 mg po daily.  This is lower than home dose as patient admitted with supratherapeutic INR and is receiving antibiotic therapy with Levaquin.  Will recheck CBC and INR in AM.   Pharmacy will continue to follow.  Scarpena,Crystal  G 12/21/2014,11:18 AM  9/27 at 1400 Per MD, would like to transition patient to Lovenox 1mg /kg q12h from heparin infusion.  Spoke with RN.  Will start Lovenox 65 mg subq q12h at 1500 (1 hour after stopping heparin drip).   Murrell Converse, PharmD Clinical Pharmacist 12/21/2014

## 2014-12-21 NOTE — Consult Note (Addendum)
Palliative Medicine Inpatient Consult Note   Name: Gwendolyn Norman Date: 12/21/2014 MRN: [id number]  DOB: 1931/05/08  Referring Physician: Nicholes Mango, MD   Primary Care Provider:  Dr Ree Shay  Palliative Care consult requested for this 81 y.o. female for goals of medical therapy in patient with terminal colon cancer.  She is otherwise complicated medically with the following conditions:  IMPRESSION: 1.  Adenocarcinoma of colon ---path = 'fragment' of adenocarcinoma ---several masses found on colonoscopy with largest at hepatic flexure (see report) ---Pt is aware of cancer diagnosis and resultant anemia ---She does NOT want surgery or blood products since she is Jehovah's Witness 2. Esophagitis seen on endo ---Bezoar/ food products were also present on endo 3.  Mild pain at times in abdomen ---chart says 'no pain' but pt admitted to 'nerve twinges' in abdomen now and then today 4.  Parkinson's with decline in gait over the last 6 months and worse in last 2 mos ---daughter really wants her in a facility since she cannot care for her physically any longer while also careing for others in home 5.  Iron Deficiency Anemia due to bleeding from colon cancer ---getting IV iron ---Jehovah's Witness and declines all blood products.  ---'Baseline Hgb reported to be around 9 --unknown why she has been anemic at 'baseline' ---Hgb dropped to 7.7 on 9/20 then 6.5 --and it is 6.9 now 6.  Prosthetic (mechanical) Mitral Valve  ---to get IV heparin (this was chosen b/c we all thought she would opt for surgery BUT now that she is not going to go to surgery, Dr. Margaretmary Eddy and I have discussed the option of wt based Lovenox as a bridge until she can be therapeutic with her INR values) ---she had coumadin coagulopathy at adm with INR greater than 6 7.  H/O chronic diastolic CHF  8.  Atrial Fibrillation on coumadin for this as well as for MVR 9.  Recent UTI 10. Osteoarthritis 11. H/O CVA ---she seems to  have some cognitive deficits / dementia --but she can make her own health care decisions and converse when she focuses 43.  GERD 13.  Pulmonary HTN 14. Elevated troponin --demand ischemia 15.  Moderate Malnutrition  TODAY'S DISCUSSIONS, DECISIONS, AND PLANS: 1.  DNR continues  2.  Pt does not want a feeding tube or to be on a ventilator and she does not want surgery for this terminal cancer in her colon. 3.  No transfusions or blood products (Jehovah's Witness) 4.  Pt would be best served if she could go to [facility] (as has been discussed) under skilled days at first. Perhaps she can gain back some of her strength and she might ambulate bettter/ again.  The IV iron might kick in and raise her hemoglobin allowing her to have some PT despite a marginally low hemoglobin.  5.  She should have a PALLIATIVE CARE consult while at [facility] under skilled rehab days. 6.  When her therapy results in a plateau or when she starts to have more symptoms from her progressing colon cancer, it would be time to transition her over to Hospice while getting long term care (via Medicaid coverage).   7.  I would favor changing her over to Lovenox with coumadin (as a bridge) instead of heparin (since she is not going to surgery)and limiting medications at discharge to those meds most important along with some comfort oriented medications.  I will be glad to see her before her discharge.  8.  I have spoken with  nursing, attending, social worker, Dr Ma Hillock , PT, patient and her daughter.      REVIEW OF SYSTEMS:  Pt says she has some nerve twinges in her abdomen at times. She still eats ok.  Hard of hearing apparently. She was unable to answer all questions due to pts inattention. PT has recommended SNF on 9/22.   SPIRITUAL SUPPORT SYSTEM: Yes.  SOCIAL HISTORY:  reports that she has never smoked. She has never used smokeless tobacco. She reports that she does not drink alcohol or use illicit drugs.  Lives at home with daughter  and daughter's family.  Daughter is having trouble caring for pt since she has started requiring much more ADL care given decline in gait over last 6 mos time (worse over last month).  Pt has walker, BSC, and cane in home.  Daughter in process of applying for Medicaid and pt has [facility] Medicare.    LEGAL DOCUMENTS:  I will place a [place] DNR form in paper chart  CODE STATUS: DNR as of today  PAST MEDICAL HISTORY: Past Medical History  Diagnosis Date  . CHF (congestive heart failure)     diastolic dysfunction  . Parkinson disease   . Hypertension   . Osteoarthritis   . Rheumatic heart disease   . H/O mitral valve replacement with mechanical valve   . GERD (gastroesophageal reflux disease)   . CVA (cerebral infarction)     No residual neuro deficits  . Atrial fibrillation   . Pulmonary hypertension     PAST SURGICAL HISTORY:  Past Surgical History  Procedure Laterality Date  . Mitral valve replacement    . Esophagogastroduodenoscopy (egd) with propofol N/A 12/21/2014    Procedure: ESOPHAGOGASTRODUODENOSCOPY (EGD) WITH PROPOFOL;  Surgeon: Hulen Luster, MD;  Location: [facility] ENDOSCOPY;  Service: Gastroenterology;  Laterality: N/A;  . Colonoscopy N/A 12/17/2014    Procedure: COLONOSCOPY;  Surgeon: Hulen Luster, MD;  Location: [facility] ENDOSCOPY;  Service: Endoscopy;  Laterality: N/A;    ALLERGIES:  is allergic to penicillins and doxycycline.  MEDICATIONS:  Current Facility-Administered Medications  Medication Dose Route Frequency Provider Last Rate Last Dose  . acetaminophen (TYLENOL) tablet 500-1,000 mg  500-1,000 mg Oral Q6H PRN Gladstone Lighter, MD      . antiseptic oral rinse (CPC / CETYLPYRIDINIUM CHLORIDE 0.05%) solution 7 mL  7 mL Mouth Rinse BID Aldean Jewett, MD   7 mL at 12/21/14 0830  . calcium-vitamin D (OSCAL WITH D) 500-200 MG-UNIT per tablet   Oral BID Gladstone Lighter, MD   1 tablet at 12/21/14 0920  . carbidopa-levodopa (SINEMET IR) 25-100 MG per tablet immediate release 2  tablet  2 tablet Oral TID Gladstone Lighter, MD   2 tablet at 12/21/14 0920  . ferrous sulfate tablet 650 mg  650 mg Oral BID Gladstone Lighter, MD   650 mg at 42/35/36 1443  . folic acid (FOLVITE) tablet 1 mg  1 mg Oral Daily Gladstone Lighter, MD   1 mg at 12/21/14 0920  . furosemide (LASIX) tablet 20 mg  20 mg Oral Daily Gladstone Lighter, MD   20 mg at 12/21/14 0920  . heparin ADULT infusion 100 units/mL (25000 units/250 mL)  950 Units/hr Intravenous Continuous Crystal Jennefer Bravo, RPH 9.5 mL/hr at 12/21/14 1222 950 Units/hr at 12/21/14 1222  . iron sucrose (VENOFER) 100 mg in sodium chloride 0.9 % 100 mL IVPB  100 mg Intravenous Once Nicholes Mango, MD   100 mg at 12/21/14 1221  . levofloxacin (LEVAQUIN) tablet  250 mg  250 mg Oral Daily Nicholes Mango, MD   250 mg at 12/21/14 7989  . metoprolol (LOPRESSOR) tablet 50 mg  50 mg Oral BID Gladstone Lighter, MD   50 mg at 12/21/14 0920  . pantoprazole (PROTONIX) EC tablet 40 mg  40 mg Oral Daily Gladstone Lighter, MD   40 mg at 12/21/14 0920  . potassium chloride SA (K-DUR,KLOR-CON) CR tablet 10 mEq  10 mEq Oral Daily Gladstone Lighter, MD   10 mEq at 12/21/14 0920  . senna-docusate (Senokot-S) tablet 1 tablet  1 tablet Oral QHS PRN Gladstone Lighter, MD      . sodium chloride 0.9 % injection 3 mL  3 mL Intravenous Q12H Gladstone Lighter, MD   3 mL at 12/21/14 0920  . vitamin B-12 (CYANOCOBALAMIN) tablet 1,000 mcg  1,000 mcg Oral Daily Gladstone Lighter, MD   1,000 mcg at 12/21/14 0920  . warfarin (COUMADIN) tablet 2 mg  2 mg Oral q1800 Crystal G Scarpena, RPH      . Warfarin - Pharmacist Dosing Inpatient   Does not apply q1800 Loleta Dicker, RPH        Vital Signs: BP 110/52 mmHg  Pulse 82  Temp(Src) 98 F (36.7 C) (Oral)  Resp 20  Ht 5\' 7"  (1.702 m)  Wt 63.549 kg (140 lb 1.6 oz)  BMI 21.94 kg/m2  SpO2 95% Filed Weights   12/14/14 1222 12/14/14 1834  Weight: 58.968 kg (130 lb) 63.549 kg (140 lb 1.6 oz)    Estimated body mass index is  21.94 kg/(m^2) as calculated from the following:   Height as of this encounter: 5\' 7"  (1.702 m).   Weight as of this encounter: 63.549 kg (140 lb 1.6 oz).  PERFORMANCE STATUS (ECOG) : 3 - Symptomatic, >50% confined to bed  PHYSICAL EXAM: Sitting up in bed eating a sandwich EOMOI  LABS: CBC:    Component Value Date/Time   WBC 8.0 12/21/2014 1153   WBC 3.4* 07/01/2014 0859   HGB 6.9* 12/21/2014 1153   HGB 9.7* 07/01/2014 0859   HCT 21.2* 12/21/2014 1153   HCT 30.1* 07/01/2014 0859   PLT 202 12/21/2014 1153   PLT 278 07/01/2014 0859   MCV 82.8 12/21/2014 1153   MCV 88 07/01/2014 0859   NEUTROABS 5.9 12/08/2014 1454   NEUTROABS 2.3 07/01/2014 0859   LYMPHSABS 0.5* 12/08/2014 1454   LYMPHSABS 0.6* 07/01/2014 0859   MONOABS 0.5 12/08/2014 1454   MONOABS 0.4 07/01/2014 0859   EOSABS 0.0 12/08/2014 1454   EOSABS 0.1 07/01/2014 0859   BASOSABS 0.0 12/08/2014 1454   BASOSABS 0.0 07/01/2014 0859   Comprehensive Metabolic Panel:    Component Value Date/Time   NA 131* 12/16/2014 0838   NA 141 10/05/2012 0342   K 3.5 12/16/2014 0838   K 3.8 10/05/2012 0342   CL 95* 12/16/2014 0838   CL 107 10/05/2012 0342   CO2 32 12/16/2014 0838   CO2 34* 10/05/2012 0342   BUN 15 12/16/2014 0838   BUN 14 10/05/2012 0342   CREATININE 0.54 12/16/2014 0838   CREATININE 0.72 10/05/2012 0342   GLUCOSE 82 12/16/2014 0838   GLUCOSE 85 10/05/2012 0342   CALCIUM 7.1* 12/16/2014 0838   CALCIUM 8.4* 10/05/2012 0342   AST 18 12/14/2014 1225   AST 20 10/04/2012 0802   ALT <5* 12/14/2014 1225   ALT 15 10/04/2012 0802   ALKPHOS 105 12/14/2014 1225   ALKPHOS 101 10/04/2012 0802   BILITOT 0.6 12/14/2014 1225  BILITOT 0.4 10/04/2012 0802   PROT 5.1* 12/14/2014 1225   PROT 6.8 10/04/2012 0802   ALBUMIN 1.2* 12/14/2014 1225   ALBUMIN 3.0* 10/04/2012 0802     More than 50% of the visit was spent in counseling/coordination of care: Yes  Time Spent:90 minutes

## 2014-12-21 NOTE — Clinical Social Work Note (Signed)
Dr. Megan Salon with Palliative Care informed CSW that she has spoken to patient and patient's daughter and that the daughter and patient are wanting to go to [facility] under skilled days with a Palliative Care Consult. CSW has updated Broadus John at [employer] that discharge will more than likely be tomorrow. Shela Leff MSW,LCSW [phone]

## 2014-12-21 NOTE — Progress Notes (Signed)
P-atient is hemodynamically stable,weak and denies pain,no significant changes this shift.

## 2014-12-21 NOTE — Care Management (Signed)
Patient and family are considering surgical options and possible transfer to tertiary facility for surgical intervention by the bloodless surgery clinic at [facility].  Surgeon was to attempt to discuss this option with patient and family 9/26.  It is documented by attending that family may not want to transfer.  Palliative care consult is pending

## 2014-12-22 LAB — BASIC METABOLIC PANEL
Anion gap: 7 (ref 5–15)
BUN: 13 mg/dL (ref 6–20)
CALCIUM: 6.5 mg/dL — AB (ref 8.9–10.3)
CHLORIDE: 91 mmol/L — AB (ref 101–111)
CO2: 29 mmol/L (ref 22–32)
CREATININE: 0.44 mg/dL (ref 0.44–1.00)
GFR calc Af Amer: >60 mL/min (ref >60)
Glucose, Bld: 84 mg/dL (ref 65–99)
Potassium: 3.6 mmol/L (ref 3.5–5.1)
SODIUM: 127 mmol/L — AB (ref 135–145)

## 2014-12-22 LAB — PROTIME-INR
INR: 1.43
PROTHROMBIN TIME: 17.6 s — AB (ref 11.4–15.0)

## 2014-12-22 LAB — CBC
HCT: 20 % — ABNORMAL LOW (ref 35.0–47.0)
Hemoglobin: 6.5 g/dL — ABNORMAL LOW (ref 12.0–16.0)
MCH: 26.8 pg (ref 26.0–34.0)
MCHC: 32.5 g/dL (ref 32.0–36.0)
MCV: 82.5 fL (ref 80.0–100.0)
PLATELETS: 210 10*3/uL (ref 150–440)
RBC: 2.43 MIL/uL — ABNORMAL LOW (ref 3.80–5.20)
RDW: 20.2 % — AB (ref 11.5–14.5)
WBC: 6.7 10*3/uL (ref 3.6–11.0)

## 2014-12-22 MED ORDER — WARFARIN SODIUM 3 MG PO TABS: 3.0000 mg | ORAL_TABLET | Freq: Every day | ORAL | Status: DC

## 2014-12-22 MED ORDER — ENSURE ENLIVE PO LIQD: 237.0000 mL | Freq: Two times a day (BID) | ORAL | Status: AC

## 2014-12-22 MED ORDER — ENOXAPARIN SODIUM 150 MG/ML ~~LOC~~ SOLN: 1.0000 mg/kg | Freq: Two times a day (BID) | SUBCUTANEOUS | Status: DC

## 2014-12-22 MED ORDER — WARFARIN SODIUM 3 MG PO TABS
3.0000 mg | ORAL_TABLET | Freq: Once | ORAL | Status: DC
Start: 2014-12-22 — End: 2014-12-22
  Filled 2014-12-22: qty 1

## 2014-12-22 MED ORDER — POTASSIUM CHLORIDE CRYS ER 10 MEQ PO TBCR: 10.0000 meq | EXTENDED_RELEASE_TABLET | Freq: Every day | ORAL | Status: AC

## 2014-12-22 NOTE — Discharge Summary (Signed)
[facility] NAME: Stevana Dufner    MR#:  [id number]  DATE OF BIRTH:  [birth year]  DATE OF ADMISSION:  12/14/2014 ADMITTING PHYSICIAN: Gladstone Lighter, MD  DATE OF DISCHARGE: 12/22/2014  PRIMARY CARE PHYSICIAN: Sherrin Daisy, MD    ADMISSION DIAGNOSIS:  Swelling [R60.9] Gastrointestinal hemorrhage with melena [K92.1]  DISCHARGE DIAGNOSIS:   iron deficiency anemia secondary to colon cancer adenocarcinoma per pathology results Jehovah's Witness Severe protein calorie malnutrition Pressure ulcer Melena   SECONDARY DIAGNOSIS:   Past Medical History  Diagnosis Date  . CHF (congestive heart failure)     diastolic dysfunction  . Parkinson disease   . Hypertension   . Osteoarthritis   . Rheumatic heart disease   . H/O mitral valve replacement with mechanical valve   . GERD (gastroesophageal reflux disease)   . CVA (cerebral infarction)     No residual neuro deficits  . Atrial fibrillation   . 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 Patient is aware of the risk of bleeding with anticoagulants including heparin versus Lovenox and Coumadin. Agreeable with antocoagulation at this point after understanding the  risks and benefits - Patient is started on Lovenox 1 mg/kg subcutaneous for bridging as well as Coumadin - Continue Protonix - Dr. Megan Salon , palliative care, has discussed with the patient and her daughter regarding hospice care.appreciate palliative care recommendations, -s/p iv venofer 3 times   Urinary tract infection - Treated, UA is negative  3. St. Judes Mechanical mitral valve  - Appreciate cardiology consultation - resume Coumadin with Lovenox bridging in the interim   4.Parkinson's - Continue Sinemet  5.Hypertension - Blood pressure is soft - Hold amlodipine  6.generalized weakness  PT is recommending skilled nursing care  DISCHARGE CONDITIONS:   fair  CONSULTS OBTAINED:  Treatment Team:  Teodoro Spray, MD Clayburn Pert, MD Lloyd Huger, MD Corey Skains, MD   PROCEDURES EGD and colonoscopy  DRUG ALLERGIES:   Allergies  Allergen Reactions  . Penicillins Shortness Of Breath and Other (See Comments)    Has patient had a PCN reaction causing immediate rash, facial/tongue/throat swelling, SOB or lightheadedness with hypotension: Yes Has patient had a PCN reaction causing severe rash involving mucus membranes or skin necrosis: No Has patient had a PCN reaction that required hospitalization No Has patient had a PCN reaction occurring within the last 10 years: No If all of the above answers are "NO", then may proceed with Cephalosporin use.   Marland Kitchen Doxycycline Rash    DISCHARGE MEDICATIONS:   Current Discharge Medication List    START taking these medications   Details  enoxaparin (LOVENOX) 150 MG/ML injection Inject 0.42 mLs (65 mg total) into the skin every 12 (twelve) hours. Qty: 10 Syringe, Refills: 0    feeding supplement, ENSURE ENLIVE, (  ENSURE ENLIVE) LIQD Take 237 mLs by mouth 2 (two) times daily between meals. Qty: 237 mL, Refills: 12    potassium chloride (K-DUR,KLOR-CON) 10 MEQ tablet Take 1 tablet (10 mEq total) by mouth  daily. Qty: 30 tablet, Refills: 0      CONTINUE these medications which have CHANGED   Details  warfarin (COUMADIN) 3 MG tablet Take 1 tablet (3 mg total) by mouth daily at 6 PM. Take Coumadin 3 mg once daily until September 29, repeat PT/INR on September 29 and further dosing per primary care physician at [facility] Qty: 3 tablet, Refills: 0   Associated Diagnoses: IDA (iron deficiency anemia); MGUS (monoclonal gammopathy of unknown significance)      CONTINUE these medications which have NOT CHANGED   Details  acetaminophen (TYLENOL) 500 MG tablet Take 500-1,000 mg by mouth every 6 (six) hours as needed for mild pain or headache.    Associated Diagnoses: IDA (iron deficiency anemia); MGUS (monoclonal gammopathy of unknown significance)    amLODipine (NORVASC) 5 MG tablet Take 5 mg by mouth daily.   Associated Diagnoses: IDA (iron deficiency anemia); MGUS (monoclonal gammopathy of unknown significance)    Calcium Carb-Cholecalciferol (CALCIUM 600+D3) 600-200 MG-UNIT TABS Take 1 tablet by mouth 2 (two) times daily.    carbidopa-levodopa (SINEMET IR) 25-100 MG per tablet Take 2 tablets by mouth 3 (three) times daily.    ferrous sulfate 325 (65 FE) MG tablet Take 650 mg by mouth 2 (two) times daily.    Associated Diagnoses: IDA (iron deficiency anemia); MGUS (monoclonal gammopathy of unknown significance)    folic acid (FOLVITE) 1 MG tablet Take 1 mg by mouth daily.   Associated Diagnoses: IDA (iron deficiency anemia); MGUS (monoclonal gammopathy of unknown significance)    furosemide (LASIX) 20 MG tablet Take 1 tablet (20 mg total) by mouth daily. Qty: 30 tablet, Refills: 1    metoprolol (LOPRESSOR) 50 MG tablet Take 50 mg by mouth 2 (two) times daily.   Associated Diagnoses: IDA (iron deficiency anemia); MGUS (monoclonal gammopathy of unknown significance)    nitrofurantoin (MACRODANTIN) 100 MG capsule Take 1 capsule (100 mg total) by mouth 2 (two) times daily. Qty: 14  capsule, Refills: 0    pantoprazole (PROTONIX) 40 MG tablet Take 40 mg by mouth daily.    Associated Diagnoses: IDA (iron deficiency anemia); MGUS (monoclonal gammopathy of unknown significance)    vitamin B-12 (CYANOCOBALAMIN) 1000 MCG tablet Take 1,000 mcg by mouth daily.   Associated Diagnoses: IDA (iron deficiency anemia); MGUS (monoclonal gammopathy of unknown significance)         DISCHARGE INSTRUCTIONS:   Activity  as tolerated as recommended at physical therapy Repeat PT/INR on September 29 and further Coumadin dosing by primary care physician Patient is to follow-up with primary care physician in 2-3 days Follow-up with oncology as scheduled on September 30 at 9 AM Follow-up with palliative care in a week or sooner as needed Follow-up with cardiology Dr. Nehemiah Massed in 3-5 days   DIET:  Diet-regular diet with iron Rich food and nutrition supplements  DISCHARGE CONDITION:  Fair  ACTIVITY:  Activity as tolerated  OXYGEN:  Home Oxygen: No.   Oxygen Delivery: room air  DISCHARGE LOCATION:  nursing home   If you experience worsening of your admission symptoms, develop shortness of breath, life threatening emergency, suicidal or homicidal thoughts you must seek medical attention immediately by calling 911 or calling your MD immediately  if symptoms less severe.  You Must read complete instructions/literature along  with all the possible adverse reactions/side effects for all the Medicines you take and that have been prescribed to you. Take any new Medicines after you have completely understood and accpet all the possible adverse reactions/side effects.   Please note  You were cared for by a hospitalist during your hospital stay. If you have any questions about your discharge medications or the care you received while you were in the hospital after you are discharged, you can call the unit and asked to speak with the hospitalist on call if the hospitalist that took care of  you is not available. Once you are discharged, your primary care physician will handle any further medical issues. Please note that NO REFILLS for any discharge medications will be authorized once you are discharged, as it is imperative that you return to your primary care physician (or establish a relationship with a primary care physician if you do not have one) for your aftercare needs so that they can reassess your need for medications and monitor your lab values.     Today  Chief Complaint  Patient presents with  . Abnormal Lab   Patient is resting comfortably. Denies any abdominal pain. Denies any nausea or vomiting. Intermittent episodes of abdominal pain which is manageable with the current pain medications  ROS:  CONSTITUTIONAL: Denies fevers, chills. Reporting fatigue and weakness.  EYES: Denies blurry vision, double vision, eye pain. EARS, NOSE, THROAT: Denies tinnitus, ear pain, hearing loss. RESPIRATORY: Denies cough, wheeze, shortness of breath.  CARDIOVASCULAR: Denies chest pain, palpitations, edema.  GASTROINTESTINAL: Denies nausea, vomiting, diarrhea, abdominal pain. Denies bright red blood per rectum. GENITOURINARY: Denies dysuria, hematuria. ENDOCRINE: Denies nocturia or thyroid problems. HEMATOLOGIC AND LYMPHATIC: Denies easy bruising or bleeding. SKIN: Denies rash or lesion. MUSCULOSKELETAL: Denies pain in neck, back, shoulder, knees, hips or arthritic symptoms.  NEUROLOGIC: Denies paralysis, paresthesias.  PSYCHIATRIC: Denies anxiety or depressive symptoms.   VITAL SIGNS:  Blood pressure 101/49, pulse 93, temperature 98.3 F (36.8 C), temperature source Oral, resp. rate 24, height 5\' 7"  (1.702 m), weight 63.957 kg (141 lb), SpO2 97 %.  I/O:   Intake/Output Summary (Last 24 hours) at 12/22/14 1145 Last data filed at 12/22/14 1013  Gross per 24 hour  Intake 696.73 ml  Output      0 ml  Net 696.73 ml    PHYSICAL EXAMINATION:  GENERAL:  81 y.o.-year-old  patient lying in the bed with no acute distress.  EYES: Pupils equal, round, reactive to light and accommodation. No scleral icterus. Extraocular muscles intact.  HEENT: Head atraumatic, normocephalic. Oropharynx and nasopharynx clear.  NECK:  Supple, no jugular venous distention. No thyroid enlargement, no tenderness.  LUNGS: Normal breath sounds bilaterally, no wheezing, rales,rhonchi or crepitation. No use of accessory muscles of respiration.  CARDIOVASCULAR: S1, S2 normal. No murmurs, rubs, or gallops.  ABDOMEN: Soft, non-tender, non-distended. Bowel sounds present.  EXTREMITIES: No pedal edema, cyanosis, or clubbing.  NEUROLOGIC: Cranial nerves II through XII are intact. Muscle strength at her baseline. Sensation intact. Gait not checked.  PSYCHIATRIC: The patient is alert and oriented x 3.  SKIN: No obvious rash, lesion, or ulcer.   DATA REVIEW:   CBC  Recent Labs Lab 12/22/14 0450  WBC 6.7  HGB 6.5*  HCT 20.0*  PLT 210    Chemistries   Recent Labs Lab 12/22/14 0450  NA 127*  K 3.6  CL 91*  CO2 29  GLUCOSE 84  BUN 13  CREATININE 0.44  CALCIUM 6.5*  Cardiac Enzymes No results for input(s): TROPONINI in the last 168 hours.  Microbiology Results  Results for orders placed or performed during the hospital encounter of 12/14/14  Urine culture     Status: None   Collection Time: 12/14/14  4:57 PM  Result Value Ref Range Status   Specimen Description URINE, CLEAN CATCH  Final   Special Requests NONE  Final   Culture   Final    50,000 COLONIES/mL ENTEROCOCCUS FAECIUM MIXED BACTERIAL ORGANISMS    Report Status 12/17/2014 FINAL  Final   Organism ID, Bacteria ENTEROCOCCUS FAECIUM  Final      Susceptibility   Enterococcus faecium - MIC*    AMPICILLIN <=2 SENSITIVE Sensitive     NITROFURANTOIN 64 INTERMEDIATE Intermediate     LINEZOLID 2 SENSITIVE Sensitive     CIPROFLOXACIN Value in next row Intermediate      INTERMEDIATE2    LEVOFLOXACIN Value in next row  Sensitive      SENSITIVE2    TETRACYCLINE Value in next row Sensitive      SENSITIVE<=1    * 50,000 COLONIES/mL ENTEROCOCCUS FAECIUM    RADIOLOGY:  No results found.  EKG:   Orders placed or performed during the hospital encounter of 12/14/14  . EKG 12-Lead  . EKG 12-Lead  . EKG 12-Lead  . EKG 12-Lead  . EKG 12-Lead  . EKG 12-Lead      Management plans discussed with the patient, family and they are in agreement.  CODE STATUS:     Code Status Orders        Start     Ordered   12/21/14 1352  Do not attempt resuscitation (DNR)   Continuous    Question Answer Comment  In the event of cardiac or respiratory ARREST Do not call a "code blue"   In the event of cardiac or respiratory ARREST Do not perform Intubation, CPR, defibrillation or ACLS   In the event of cardiac or respiratory ARREST Use medication by any route, position, wound care, and other measures to relive pain and suffering. May use oxygen, suction and manual treatment of airway obstruction as needed for comfort.   Comments .RN may pronounce pt if death occurs.  NO FEEDING TUBE and DNI.      12/21/14 1352    Advance Directive Documentation        Most Recent Value   Type of Advance Directive  Healthcare Power of Attorney   Pre-existing out of facility DNR order (yellow form or pink MOST form)     "MOST" Form in Place?        TOTAL TIME TAKING CARE OF THIS PATIENT: 45 minutes.    @[facility] @  on 12/22/2014 at 11:45 AM  Between 7am to 6pm - Pager - [phone]  After 6pm go to [email or web address] - password [email or web address] [facility]  [facility]  Office  [phone]  CC: Primary care physician; Sherrin Daisy, MD

## 2014-12-22 NOTE — Progress Notes (Signed)
Palliative Medicine Inpatient Consult Follow Up Note   Name: Gwendolyn Norman Date: 12/22/2014 MRN: [id number]  DOB: 11-26-1931  Referring Physician: Nicholes Mango, MD  Palliative Care consult requested for this 81 y.o. female for goals of medical therapy in patient with terminal colon cancer. She is otherwise complicated with the following:  IMPRESSION: 1. Adenocarcinoma of colon ---path = 'fragment' of adenocarcinoma ---several masses found on colonoscopy with largest at hepatic flexure (see report) ---Pt is aware of cancer diagnosis and resultant anemia ---She does NOT want surgery or blood products since she is Jehovah's Witness 2. Esophagitis seen on endo ---Bezoar/ food products were also present on endo 3. Mild pain at times in abdomen ---chart says 'no pain' but pt admitted to 'nerve twinges' in abdomen now and then today 4. Parkinson's with decline in gait over the last 6 months and worse in last 2 mos ---daughter really wants her in a facility since she cannot care for her physically any longer while also careing for others in home 5. Iron Deficiency Anemia due to bleeding from colon cancer ---getting IV iron ---Jehovah's Witness and declines all blood products.  ---'Baseline Hgb reported to be around 9 --unknown why she has been anemic at 'baseline' ---Hgb dropped to 7.7 on 9/20 then 6.5 --and it is 6.9 now 6. Prosthetic (mechanical) Mitral Valve  ---to get IV heparin (this was chosen b/c we all thought she would opt for surgery BUT now that she is not going to go to surgery, Dr. Margaretmary Eddy and I have discussed the option of wt based Lovenox as a bridge until she can be therapeutic with her INR values) ---she had coumadin coagulopathy at adm with INR greater than 6 7. H/O chronic diastolic CHF  8. Atrial Fibrillation on coumadin for this as well as for MVR 9. Recent UTI 10. Osteoarthritis 11. H/O CVA ---she seems to have some cognitive deficits / dementia --but she  can make her own health care decisions and converse when she focuses 44. GERD 13. Pulmonary HTN 14. Elevated troponin --demand ischemia 15. Moderate Malnutrition   PLAN:  Pt is going to SNF today for rehab [place] (at [facility]).  DNR   REVIEW OF SYSTEMS:  She says she feels about as good as she is going to.   CODE STATUS: DNR   PAST MEDICAL HISTORY: Past Medical History  Diagnosis Date  . CHF (congestive heart failure)     diastolic dysfunction  . Parkinson disease   . Hypertension   . Osteoarthritis   . Rheumatic heart disease   . H/O mitral valve replacement with mechanical valve   . GERD (gastroesophageal reflux disease)   . CVA (cerebral infarction)     No residual neuro deficits  . Atrial fibrillation   . Pulmonary hypertension     PAST SURGICAL HISTORY:  Past Surgical History  Procedure Laterality Date  . Mitral valve replacement    . Esophagogastroduodenoscopy (egd) with propofol N/A 12/16/2014    Procedure: ESOPHAGOGASTRODUODENOSCOPY (EGD) WITH PROPOFOL;  Surgeon: Hulen Luster, MD;  Location: [facility] ENDOSCOPY;  Service: Gastroenterology;  Laterality: N/A;  . Colonoscopy N/A 12/17/2014    Procedure: COLONOSCOPY;  Surgeon: Hulen Luster, MD;  Location: [facility] ENDOSCOPY;  Service: Endoscopy;  Laterality: N/A;    Vital Signs: BP 101/49 mmHg  Pulse 93  Temp(Src) 98.3 F (36.8 C) (Oral)  Resp 24  Ht 5\' 7"  (1.702 m)  Wt 63.957 kg (141 lb)  BMI 22.08 kg/m2  SpO2 97%  Filed Weights   12/14/14 1222 12/14/14 1834 12/22/14 0556  Weight: 58.968 kg (130 lb) 63.549 kg (140 lb 1.6 oz) 63.957 kg (141 lb)    Estimated body mass index is 22.08 kg/(m^2) as calculated from the following:   Height as of this encounter: 5\' 7"  (1.702 m).   Weight as of this encounter: 63.957 kg (141 lb).  PHYSICAL EXAM: NAD Weak but alert and verbal EOMI Hrt rrr no mgr Lungs cta Abd soft and NT Ext atrophic muscles  LABS: CBC:    Component Value Date/Time   WBC 6.7  12/22/2014 0450   WBC 3.4* 07/01/2014 0859   HGB 6.5* 12/22/2014 0450   HGB 9.7* 07/01/2014 0859   HCT 20.0* 12/22/2014 0450   HCT 30.1* 07/01/2014 0859   PLT 210 12/22/2014 0450   PLT 278 07/01/2014 0859   MCV 82.5 12/22/2014 0450   MCV 88 07/01/2014 0859   NEUTROABS 5.9 12/08/2014 1454   NEUTROABS 2.3 07/01/2014 0859   LYMPHSABS 0.5* 12/08/2014 1454   LYMPHSABS 0.6* 07/01/2014 0859   MONOABS 0.5 12/08/2014 1454   MONOABS 0.4 07/01/2014 0859   EOSABS 0.0 12/08/2014 1454   EOSABS 0.1 07/01/2014 0859   BASOSABS 0.0 12/08/2014 1454   BASOSABS 0.0 07/01/2014 0859   Comprehensive Metabolic Panel:    Component Value Date/Time   NA 127* 12/22/2014 0450   NA 141 10/05/2012 0342   K 3.6 12/22/2014 0450   K 3.8 10/05/2012 0342   CL 91* 12/22/2014 0450   CL 107 10/05/2012 0342   CO2 29 12/22/2014 0450   CO2 34* 10/05/2012 0342   BUN 13 12/22/2014 0450   BUN 14 10/05/2012 0342   CREATININE 0.44 12/22/2014 0450   CREATININE 0.72 10/05/2012 0342   GLUCOSE 84 12/22/2014 0450   GLUCOSE 85 10/05/2012 0342   CALCIUM 6.5* 12/22/2014 0450   CALCIUM 8.4* 10/05/2012 0342   AST 18 12/14/2014 1225   AST 20 10/04/2012 0802   ALT <5* 12/14/2014 1225   ALT 15 10/04/2012 0802   ALKPHOS 105 12/14/2014 1225   ALKPHOS 101 10/04/2012 0802   BILITOT 0.6 12/14/2014 1225   BILITOT 0.4 10/04/2012 0802   PROT 5.1* 12/14/2014 1225   PROT 6.8 10/04/2012 0802   ALBUMIN 1.2* 12/14/2014 1225   ALBUMIN 3.0* 10/04/2012 0802     REFERRALS TO BE ORDERED:  PALLIATIVE CARE CONSULT AT THE FACILITY  More than 50% of the visit was spent in counseling/coordination of care: YES  Time Spent:

## 2014-12-22 NOTE — Progress Notes (Signed)
Ems on floor to transport patient to [facility]. Patient on RA no distress noted. No c/o pain daughter at bedside. Ems given packet to give to facility [facility]

## 2014-12-22 NOTE — Progress Notes (Addendum)
ANTICOAGULATION CONSULT NOTE - Initial Consult  Pharmacy Consult Warfarin dosing Indication: mechanical mitral valve replacemtn/history of atrial fibrillation  Allergies  Allergen Reactions  . Penicillins Shortness Of Breath and Other (See Comments)    Has patient had a PCN reaction causing immediate rash, facial/tongue/throat swelling, SOB or lightheadedness with hypotension: Yes Has patient had a PCN reaction causing severe rash involving mucus membranes or skin necrosis: No Has patient had a PCN reaction that required hospitalization No Has patient had a PCN reaction occurring within the last 10 years: No If all of the above answers are "NO", then may proceed with Cephalosporin use.   Marland Kitchen Doxycycline Rash    Patient Measurements: Height: 5\' 7"  (170.2 cm) Weight: 141 lb (63.957 kg) IBW/kg (Calculated) : 61.6   Vital Signs: Temp: 98.3 F (36.8 C) (09/28 1125) Temp Source: Oral (09/28 1125) BP: 101/49 mmHg (09/28 1125) Pulse Rate: 93 (09/28 1125)  Labs:  Recent Labs  12/20/14 0404 12/21/14 1153 12/22/14 0450  HGB 6.5* 6.9* 6.5*  HCT 20.1* 21.2* 20.0*  PLT 171 202 210  APTT  --  40*  --   LABPROT  --  18.3* 17.6*  INR  --  1.50 1.43  CREATININE  --   --  0.44    Estimated Creatinine Clearance: 54.5 mL/min (by C-G formula based on Cr of 0.44).   Medical History: Past Medical History  Diagnosis Date  . CHF (congestive heart failure)     diastolic dysfunction  . Parkinson disease   . Hypertension   . Osteoarthritis   . Rheumatic heart disease   . H/O mitral valve replacement with mechanical valve   . GERD (gastroesophageal reflux disease)   . CVA (cerebral infarction)     No residual neuro deficits  . Atrial fibrillation   . Pulmonary hypertension     Medications:  Scheduled:  . antiseptic oral rinse  7 mL Mouth Rinse BID  . carbidopa-levodopa  2 tablet Oral TID  . enoxaparin (LOVENOX) injection  1 mg/kg Subcutaneous Q12H  . feeding supplement (ENSURE  ENLIVE)  237 mL Oral BID BM  . ferrous sulfate  650 mg Oral Daily  . folic acid  1 mg Oral Daily  . furosemide  20 mg Oral Daily  . metoprolol  50 mg Oral BID  . pantoprazole  40 mg Oral Daily  . potassium chloride  10 mEq Oral Daily  . sodium chloride  3 mL Intravenous Q12H  . vitamin B-12  1,000 mcg Oral Daily  . warfarin  3 mg Oral ONCE-1800  . Warfarin - Pharmacist Dosing Inpatient   Does not apply q1800   Infusions:     Assessment: Pharmacy consulted to initiate heparin drip with no bolus and warfarin therapy in an 81 yo female with history of atrial fibrillation and mechanical mitral valve.  PTA warfarin dosing of warfarin 2 mg every except 3 mg on Mon, Tues, Wed.  INR on admission of 6.61.  Patient on antibiotic therapy with macrobid at this time.   Patient's anticoagulation withheld for possible surgery. Per MD, no surgery at this time.    Baseline labs pending (aptt, INR)  9/27: Per MD, would like to transition patient to Lovenox 1mg /kg q12h from heparin infusion. Will start Lovenox 65 mg subq q12h at 1500 (1 hour after stopping heparin drip).   9/28: MD aware of pt's Hgb of 6.5 today, relatively stable. Pt is a Jehovah's witness.   Goal of Therapy:  INR goal 2.5-3.5 Monitor platelets  by anticoagulation protocol: Yes   Plan:  Based on subtherapeutic INR and decrease in INR, will order warfarin 3 mg PO x1 for today.  Will recheck INR in AM if pt still here.   Pharmacy will continue to follow.  Rayna Sexton L 12/22/2014,2:31 PM

## 2014-12-22 NOTE — Clinical Social Work Note (Signed)
CSW notified pt and pt's daughter (in the room)  That pt would DC to [facility] today via EMS.  CSW signing off.

## 2014-12-22 NOTE — Discharge Instructions (Signed)
Activity  as tolerated as recommended at physical therapy Diet-regular diet with iron Rich food Repeat PT/INR on September 29 and further Coumadin dosing by primary care physician Patient is to follow-up with primary care physician in 2-3 days Follow-up with oncology as scheduled on September 30 at 9 AM Follow-up with palliative care in a week or sooner as needed Follow-up with cardiology Dr. Nehemiah Massed in 3-5 days

## 2014-12-22 NOTE — Care Management Important Message (Signed)
Important Message  Patient Details  Name: Gwendolyn Norman MRN: [id number] Date of Birth: 04-15-1931   Medicare Important Message Given:  Yes-fourth notification given    Darius Bump Allmond 12/22/2014, 11:23 AM

## 2014-12-22 NOTE — Consult Note (Signed)
ONCOLOGY followup note -   HPI: still weak. Denies pain. No nausea or vomiting. ROS: No fevers. Denies bright red blood in stools currently. Exam: resting in bed, weak-looking, otherwise A, O x3, NAD.             Vitals - 98, 82, 20, 110/52, 95% on RA.             Lungs - b/l CTA             Abd - soft, NT  Labs: Hb 6.9, WBC 8.0, plt 202. Recent CEA 3.5.  Impression/Recommendations: 82/female with long standing anemia and other co-morbidities, now diagnosed with adenocarcinoma near hepatic flexure, partially obstructing, also presented with progressive anemia. Patient is Jehovah's witness. Surgeon Dr.Cooper recommended transfer to [facility] but patient does not want to pursue this, she understands risks involved with untreated malignancy including death. She is being planned for placement in SNF, will get rad-onc consult to see if XRT would be beneficial in palliating continued complications from colon cancer. Patient and daughter present are agreeable to this plan. Marland Kitchen

## 2014-12-22 NOTE — Progress Notes (Signed)
Physical Therapy Treatment Patient Details Name: Gwendolyn Norman MRN: [id number] DOB: 02-Aug-1931 Today's Date: 12/22/2014    History of Present Illness Patient is a pleasant 81 y/o female that presents with possible GI complications and has been noted to have low hemoglobin since this admission. She was recently diagnosed with Parkinson's and has been participating with HHPT recently. Pt recently diagnosed with actively bleeding colon cancer. Due to religious beliefs, pt refusing blood transfusions even though she has low hemoglobin.      PT Comments    Pt able to transfer and take steps better this morning. She is more aware of the appropriate sequence to accomplish steps with therapist's weight shift to facilitate swing through. She still has generalized weakness and gait deficits that will benefit from skilled PT, but she is willing to participate in therapy and states no distress during therapy tasks.     Follow Up Recommendations  SNF     Equipment Recommendations       Recommendations for Other Services       Precautions / Restrictions Precautions Precautions: Fall Restrictions Weight Bearing Restrictions: No    Mobility  Bed Mobility Overal bed mobility: Needs Assistance Bed Mobility: Supine to Sit     Supine to sit: Mod assist     General bed mobility comments: Pt requires assist for her trunk and LEs in order to get to EOB. Pt in NAD during any bed mobility  Transfers Overall transfer level: Needs assistance Equipment used: 2 person hand held assist Transfers: Sit to/from Stand Sit to Stand: Mod assist;Max assist         General transfer comment: Pt requires heavy assist to get into standing, especially with posterior lean. She needs cues for hand placement and to lean forward.  (Pt denies any lightheadedness )  Ambulation/Gait Ambulation/Gait assistance: Mod assist Ambulation Distance (Feet): 3 Feet Assistive device: Rolling walker (2 wheeled) Gait  Pattern/deviations: Step-to pattern;Decreased step length - left;Decreased step length - right;Shuffle Gait velocity: greatly decreased Gait velocity interpretation: <1.8 ft/sec, indicative of risk for recurrent falls General Gait Details: Pt requires weight shifting from therapist in order to clear each foot to take a step. She also needs heavy trunk support as well as cues for sequencing of RW. Pt happy to participate and practice ambulation (Better use of step with associated weight shift)   Stairs            Wheelchair Mobility    Modified Rankin (Stroke Patients Only)       Balance Overall balance assessment: Needs assistance Sitting-balance support: Bilateral upper extremity supported Sitting balance-Leahy Scale: Fair     Standing balance support: Bilateral upper extremity supported Standing balance-Leahy Scale: Poor Standing balance comment: Posterior lean                    Cognition Arousal/Alertness: Awake/alert Behavior During Therapy: WFL for tasks assessed/performed Overall Cognitive Status: Within Functional Limits for tasks assessed                      Exercises      General Comments        Pertinent Vitals/Pain Pain Assessment: No/denies pain    Home Living                      Prior Function            PT Goals (current goals can now be found in the care plan  section) Acute Rehab PT Goals Patient Stated Goal: to participate in therapy PT Goal Formulation: With patient Time For Goal Achievement: 12/30/14 Potential to Achieve Goals: Good Progress towards PT goals: Progressing toward goals    Frequency  Min 2X/week    PT Plan Current plan remains appropriate    Co-evaluation             End of Session Equipment Utilized During Treatment: Gait belt Activity Tolerance: Patient tolerated treatment well Patient left: in chair;with call bell/phone within reach;with chair alarm set;with family/visitor  present     Time: 5361-4431 PT Time Calculation (min) (ACUTE ONLY): 15 min  Charges:                       G CodesJanyth Contes 01-07-15, 11:06 AM  Janyth Contes, SPT. [phone]

## 2014-12-22 NOTE — Progress Notes (Signed)
Report called to nurse at [facility] patient status and discharge instructions gone over with staff. Will give packet to ems. Will call ems for transport.

## 2014-12-22 NOTE — Progress Notes (Signed)
[facility] Encounter Note  Patient: Gwendolyn Norman / Admit Date: 12/14/2014 / Date of Encounter: 12/22/2014, 7:27 AM   Subjective: Weakness and fatigue and shortness of breath but no evidence of hemodynamic changes or cardiac symptoms Patient has a significantly lower hemoglobin at this time but no overt bleeding Heart rate controlled at this time Patient is currently wishing to not be transferred and pursue palliative care Review of Systems: Positive for: Shortness of breath weakness Negative for: Vision change, hearing change, syncope, dizziness, nausea, vomiting,diarrhea, bloody stool, stomach pain, cough, congestion, diaphoresis, urinary frequency, urinary pain,skin lesions, skin rashes Others previously listed  Objective: Telemetry: Atrial fibrillation Physical Exam: Blood pressure 97/47, pulse 80, temperature 98 F (36.7 C), temperature source Oral, resp. rate 16, height 5\' 7"  (1.702 m), weight 141 lb (63.957 kg), SpO2 97 %. Body mass index is 22.08 kg/(m^2). General: Well developed, well nourished, in no acute distress. Head: Normocephalic, atraumatic, sclera non-icteric, no xanthomas, nares are without discharge. Neck: No apparent masses Lungs: Normal respirations with no wheezes, few rhonchi, no rales , no crackles   Heart: Irregular rate and rhythm, mechanical S1 S2, apical murmur, no rub, no gallop, PMI is normal size and placement, carotid upstroke normal without bruit, jugular venous pressure normal Abdomen: Soft, non-tender, non-distended with normoactive bowel sounds. No hepatosplenomegaly. Abdominal aorta is normal size without bruit Extremities: 1+ edema, no clubbing, no cyanosis, no ulcers,  Peripheral: 2+ radial, 2+ femoral, 2+ dorsal pedal pulses Neuro: Alert and oriented. Moves all extremities spontaneously. Psych:  Responds to questions appropriately with a normal affect.   Intake/Output Summary (Last 24 hours) at 12/22/14 0727 Last data  filed at 12/21/14 1800  Gross per 24 hour  Intake 582.73 ml  Output      0 ml  Net 582.73 ml    Inpatient Medications:  . antiseptic oral rinse  7 mL Mouth Rinse BID  . carbidopa-levodopa  2 tablet Oral TID  . enoxaparin (LOVENOX) injection  1 mg/kg Subcutaneous Q12H  . feeding supplement (ENSURE ENLIVE)  237 mL Oral BID BM  . ferrous sulfate  650 mg Oral Daily  . folic acid  1 mg Oral Daily  . furosemide  20 mg Oral Daily  . metoprolol  50 mg Oral BID  . pantoprazole  40 mg Oral Daily  . potassium chloride  10 mEq Oral Daily  . sodium chloride  3 mL Intravenous Q12H  . vitamin B-12  1,000 mcg Oral Daily  . warfarin  2 mg Oral q1800  . Warfarin - Pharmacist Dosing Inpatient   Does not apply q1800   Infusions:    Labs:  Recent Labs  12/22/14 0450  NA 127*  K 3.6  CL 91*  CO2 29  GLUCOSE 84  BUN 13  CREATININE 0.44  CALCIUM 6.5*   No results for input(s): AST, ALT, ALKPHOS, BILITOT, PROT, ALBUMIN in the last 72 hours.  Recent Labs  12/21/14 1153 12/22/14 0450  WBC 8.0 6.7  HGB 6.9* 6.5*  HCT 21.2* 20.0*  MCV 82.8 82.5  PLT 202 210   No results for input(s): CKTOTAL, CKMB, TROPONINI in the last 72 hours. Invalid input(s): POCBNP No results for input(s): HGBA1C in the last 72 hours.   Weights: Filed Weights   12/14/14 1222 12/14/14 1834 12/22/14 0556  Weight: 130 lb (58.968 kg) 140 lb 1.6 oz (63.549 kg) 141 lb (63.957 kg)     Radiology/Studies:  Dg Chest 2 View  12/14/2014   CLINICAL  DATA:  81 year old female with 1 year history of cough.  EXAM: CHEST  2 VIEW  COMPARISON:  Prior chest x-ray 12/08/2014  FINDINGS: Unchanged massive enlargement of the cardiopericardial silhouette. Patient is status post median sternotomy. Atherosclerotic calcification present within the transverse aorta. No evidence of pulmonary edema. Small right pleural effusion with associated right basilar atelectasis. No focal scratch then no acute osseous abnormality.  IMPRESSION: 1.  Stable chest x-ray with persistent massive enlargement of the cardiopericardial silhouette which may reflect cardiomegaly and/or pericardial effusion. 2. Small right pleural effusion with associated right lower lobe atelectasis. 3. Negative for pulmonary edema.   Electronically Signed   By: Jacqulynn Cadet M.D.   On: 12/14/2014 20:09   Ct Abdomen Pelvis W Contrast  12/17/2014   CLINICAL DATA:  Large mass near hepatic flexure? Unable to get scope beyond this site. Bx's taken. Recommend CT to localize mass/check for mets. Surgical referral since mass is nearly obstructing.  EXAM: CT ABDOMEN AND PELVIS WITH CONTRAST  TECHNIQUE: Multidetector CT imaging of the abdomen and pelvis was performed using the standard protocol following bolus administration of intravenous contrast.  CONTRAST:  23mL OMNIPAQUE IOHEXOL 300 MG/ML  SOLN  COMPARISON:  None applicable  FINDINGS: Lower chest: The heart is markedly enlarged. There is mitral annulus repair. The right atrium is markedly dilated. Left atrium is also dilated. No pericardial effusion. Status post median sternotomy. Coronary artery calcifications are present. There are bilateral pleural effusions. Bibasilar atelectasis is present.  Upper abdomen: No focal abnormality identified within the liver, spleen, pancreas, or adrenal glands. The gallbladder is contracted.  Gastrointestinal tract: Stomach is distended with contrast. The proximal small bowel loops are normal in appearance. Distal small bowel loops are normal in appearance. The ascending colon has thickened wall. The mid transverse colon is involved by a large solid mass measuring 9.7 x 9.6 x 8.6 cm. The lumen of the colon is not discernible within this mass. Despite the presence of a large mass, there is little obstruction proximal to it.  Pelvis: The urinary bladder contains a small amount of air. Question of recent catheterization? The uterus is present. There is a small amount of free pelvic fluid. No adnexal  mass.  Retroperitoneum: The abdominal aorta is tortuous and densely calcified. No aneurysm. Small portacaval lymph nodes are identified, largest measuring 1.1 cm.  Abdominal wall: There is diffuse body wall edema. Status post median sternotomy.  Osseous structures: No suspicious lytic or blastic lesions are identified. Scoliosis, convex right  IMPRESSION: 1. Large lobulated mass involving the mid transverse colon, measuring at least 9.7 cm. Ascending colon has a thickened wall. However there is little obstruction due to the mass. 2. No evidence for hepatic metastases. 3. Small portacaval lymph nodes. 4. Diffuse body wall edema. 5. Small amount of ascites. 6. Marked enlargement a hard with dilated right atrium and left atrium. 7. Previous mitral annulus repair. 8. Bilateral pleural effusions, right greater than left. 9. Air within the urinary bladder. Question of recent catheterization. 10. Scoliosis and degenerative disc disease.   Electronically Signed   By: Nolon Nations M.D.   On: 12/17/2014 17:01   US Venous Img Lower Bilateral  12/14/2014   CLINICAL DATA:  Chronic bilateral lower extremity pain and swelling, discoloration.  EXAM: BILATERAL LOWER EXTREMITY VENOUS DOPPLER ULTRASOUND  TECHNIQUE: Gray-scale sonography with graded compression, as well as color Doppler and duplex ultrasound were performed to evaluate the lower extremity deep venous systems from the level of the common femoral vein  and including the common femoral, femoral, profunda femoral, popliteal and calf veins including the posterior tibial, peroneal and gastrocnemius veins when visible. The superficial great saphenous vein was also interrogated. Spectral Doppler was utilized to evaluate flow at rest and with distal augmentation maneuvers in the common femoral, femoral and popliteal veins.  COMPARISON:  None.  FINDINGS: RIGHT LOWER EXTREMITY  Common Femoral Vein: No evidence of thrombus. Normal compressibility, respiratory phasicity and  response to augmentation.  Saphenofemoral Junction: No evidence of thrombus. Normal compressibility and flow on color Doppler imaging.  Profunda Femoral Vein: No evidence of thrombus. Normal compressibility and flow on color Doppler imaging.  Femoral Vein: No evidence of thrombus. Normal compressibility, respiratory phasicity and response to augmentation.  Popliteal Vein: No evidence of thrombus. Normal compressibility, respiratory phasicity and response to augmentation.  Calf Veins: No evidence of thrombus. Normal compressibility and flow on color Doppler imaging.  Superficial Great Saphenous Vein: No evidence of thrombus. Normal compressibility and flow on color Doppler imaging.  Venous Reflux:  None.  Other Findings: Right lower extremity subcutaneous peripheral edema noted. Right inguinal prominent lymph node measures 18 x 13 x 7 mm.  LEFT LOWER EXTREMITY  Common Femoral Vein: No evidence of thrombus. Normal compressibility, respiratory phasicity and response to augmentation.  Saphenofemoral Junction: No evidence of thrombus. Normal compressibility and flow on color Doppler imaging.  Profunda Femoral Vein: No evidence of thrombus. Normal compressibility and flow on color Doppler imaging.  Femoral Vein: No evidence of thrombus. Normal compressibility, respiratory phasicity and response to augmentation.  Popliteal Vein: No evidence of thrombus. Normal compressibility, respiratory phasicity and response to augmentation.  Calf Veins: No evidence of thrombus. Normal compressibility and flow on color Doppler imaging.  Superficial Great Saphenous Vein: No evidence of thrombus. Normal compressibility and flow on color Doppler imaging.  Venous Reflux:  None.  Other Findings: Similar left lower extremity subcutaneous edema noted.  IMPRESSION: Negative for significant DVT in either extremity.  Peripheral subcutaneous edema  Borderline right inguinal adenopathy   Electronically Signed   By: Jerilynn Mages.  Shick M.D.   On: 12/14/2014  16:46   Dg Chest Port 1 View  12/08/2014   CLINICAL DATA:  Three-week history of CHF.  EXAM: PORTABLE CHEST - 1 VIEW  COMPARISON:  10/04/2012  FINDINGS: The heart is enlarged but stable. There is tortuosity and calcification of the thoracic aorta. The lungs are grossly clear. No edema or definite pleural effusion. Low lung volumes with vascular crowding and streaky basilar atelectasis. The bony thorax is intact.  IMPRESSION: Stable cardiac enlargement.  No acute pulmonary findings.   Electronically Signed   By: Marijo Sanes M.D.   On: 12/08/2014 15:51     Assessment and Recommendation  81 y.o. female with known mitral valve disease status post [employer] mechanical mitral valve replacement in the remote past with atrial fibrillation now having significant GI bleed with severe anemia likely secondary to abdominal mass needing surgical intervention although wishes not to transfer to another facility to have this performance. The patient has had no evidence of true angina and/or heart failure at this time and is at lowest risk possible for surgical intervention. The patient is a Jehovah witness for which she will not take any blood transfusions and therefore will not be able to give packed red blood cells for significant anemia as well as has a high risk of concerns of bleeding prior to surgery if heparin is used. We have discussed at length with the patient that she  does have multiple types of risks and after further discussion and patient wishes to pursue palliative care 1. Proceed to palliative care although if surgery is contemplated she is at continued lowest risk possible  2. Consideration of resuming heparin and watching for bleeding complications versus limited care 3. Continue all appropriate measures for reduction of bleeding and further condition of anemia 4. No further cardiac diagnostics necessary at this time 5. Call if further questions  Signed, Serafina Royals M.D. FACC

## 2014-12-24 ENCOUNTER — Ambulatory Visit: Payer: Medicare Other

## 2014-12-26 ENCOUNTER — Other Ambulatory Visit: Payer: Self-pay | Admitting: *Deleted

## 2014-12-26 DIAGNOSIS — C189 Malignant neoplasm of colon, unspecified: Secondary | ICD-10-CM

## 2014-12-28 ENCOUNTER — Ambulatory Visit: Payer: Medicare Other

## 2014-12-28 ENCOUNTER — Ambulatory Visit
Admission: RE | Admit: 2014-12-28 | Discharge: 2014-12-28 | Disposition: A | Discharge status: Home / Self Care | Payer: Medicare Other | Source: Ambulatory Visit | Attending: Radiation Oncology | Admitting: Radiation Oncology

## 2015-01-10 ENCOUNTER — Inpatient Hospital Stay: Payer: Medicare Other | Attending: Internal Medicine

## 2015-01-10 ENCOUNTER — Encounter: Payer: Self-pay | Admitting: *Deleted

## 2015-01-10 ENCOUNTER — Inpatient Hospital Stay (HOSPITAL_BASED_OUTPATIENT_CLINIC_OR_DEPARTMENT_OTHER): Payer: Medicare Other | Admitting: Internal Medicine

## 2015-01-10 VITALS — BP 102/70 | HR 80 | Temp 97.1°F | Resp 18

## 2015-01-10 DIAGNOSIS — Z952 Presence of prosthetic heart valve: Secondary | ICD-10-CM | POA: Diagnosis not present

## 2015-01-10 DIAGNOSIS — I509 Heart failure, unspecified: Secondary | ICD-10-CM | POA: Diagnosis not present

## 2015-01-10 DIAGNOSIS — G20 Parkinson's disease: Secondary | ICD-10-CM | POA: Diagnosis not present

## 2015-01-10 DIAGNOSIS — I4891 Unspecified atrial fibrillation: Secondary | ICD-10-CM | POA: Diagnosis not present

## 2015-01-10 DIAGNOSIS — Z8673 Personal history of transient ischemic attack (TIA), and cerebral infarction without residual deficits: Secondary | ICD-10-CM | POA: Insufficient documentation

## 2015-01-10 DIAGNOSIS — C189 Malignant neoplasm of colon, unspecified: Secondary | ICD-10-CM

## 2015-01-10 DIAGNOSIS — K219 Gastro-esophageal reflux disease without esophagitis: Secondary | ICD-10-CM | POA: Insufficient documentation

## 2015-01-10 DIAGNOSIS — C183 Malignant neoplasm of hepatic flexure: Secondary | ICD-10-CM

## 2015-01-10 DIAGNOSIS — I10 Essential (primary) hypertension: Secondary | ICD-10-CM | POA: Insufficient documentation

## 2015-01-10 DIAGNOSIS — Z79899 Other long term (current) drug therapy: Secondary | ICD-10-CM | POA: Insufficient documentation

## 2015-01-10 LAB — BASIC METABOLIC PANEL
Anion gap: 3 — ABNORMAL LOW (ref 5–15)
BUN: 19 mg/dL (ref 6–20)
CALCIUM: 6.3 mg/dL — AB (ref 8.9–10.3)
CHLORIDE: 100 mmol/L — AB (ref 101–111)
CO2: 27 mmol/L (ref 22–32)
CREATININE: 0.65 mg/dL (ref 0.44–1.00)
GFR calc non Af Amer: >60 mL/min (ref >60)
GLUCOSE: 117 mg/dL — AB (ref 65–99)
Potassium: 4.1 mmol/L (ref 3.5–5.1)
Sodium: 130 mmol/L — ABNORMAL LOW (ref 135–145)

## 2015-01-10 LAB — CBC WITH DIFFERENTIAL/PLATELET
BASOS PCT: 0 %
Basophils Absolute: 0 10*3/uL (ref 0–0.1)
Eosinophils Absolute: 0 10*3/uL (ref 0–0.7)
Eosinophils Relative: 0 %
HEMATOCRIT: 24.6 % — AB (ref 35.0–47.0)
Hemoglobin: 7.7 g/dL — ABNORMAL LOW (ref 12.0–16.0)
LYMPHS ABS: 0.6 10*3/uL — AB (ref 1.0–3.6)
Lymphocytes Relative: 5 %
MCH: 26.8 pg (ref 26.0–34.0)
MCHC: 31.4 g/dL — AB (ref 32.0–36.0)
MCV: 85.5 fL (ref 80.0–100.0)
MONO ABS: 0.7 10*3/uL (ref 0.2–0.9)
MONOS PCT: 6 %
NEUTROS ABS: 10.7 10*3/uL — AB (ref 1.4–6.5)
Neutrophils Relative %: 89 %
Platelets: 225 10*3/uL (ref 150–440)
RBC: 2.88 MIL/uL — ABNORMAL LOW (ref 3.80–5.20)
RDW: 20 % — AB (ref 11.5–14.5)
WBC: 12 10*3/uL — ABNORMAL HIGH (ref 3.6–11.0)

## 2015-01-10 NOTE — Progress Notes (Signed)
[place] OFFICE PROGRESS NOTE  Patient Care Team: Sherrin Daisy, MD as PCP - General (Family Medicine)   SUMMARY OF ONCOLOGIC HISTORY: # Colon cancer adenocarcinoma/unresectable.  # History of iron deficiency anemia/mechanical valve on anticoagulation/ history of GI bleed/   INTERVAL HISTORY:  A pleasant 81 year old female patient with multiple medical problems and recent diagnosis of adenocarcinoma of the colon unresectable currently in a nursing home is here for follow-up.  At the time of discharge from the hospital palliative radiation was considered; patient declined.  Patient is current at the nursing home; she feels weak. Intermittent abdominal pain. Intermittent constipation. Denies any frank blood in stools. Positive for weight loss. Positive for poor appetite. Positive for generalized swelling.   REVIEW OF SYSTEMS:  A complete 10 point review of system is done which is negative except mentioned above/history of present illness.   PAST MEDICAL HISTORY :  Past Medical History  Diagnosis Date  . CHF (congestive heart failure) (HCC)     diastolic dysfunction  . Parkinson disease ([place])   . Hypertension   . Osteoarthritis   . Rheumatic heart disease   . H/O mitral valve replacement with mechanical valve   . GERD (gastroesophageal reflux disease)   . CVA (cerebral infarction)     No residual neuro deficits  . Atrial fibrillation ([place])   . Pulmonary hypertension ([place])   . Colon cancer ([place])     PAST SURGICAL HISTORY :   Past Surgical History  Procedure Laterality Date  . Mitral valve replacement    . Esophagogastroduodenoscopy (egd) with propofol N/A 12/16/2014    Procedure: ESOPHAGOGASTRODUODENOSCOPY (EGD) WITH PROPOFOL;  Surgeon: Hulen Luster, MD;  Location: [facility] ENDOSCOPY;  Service: Gastroenterology;  Laterality: N/A;  . Colonoscopy N/A 12/17/2014    Procedure: COLONOSCOPY;  Surgeon: Hulen Luster, MD;  Location: [facility] ENDOSCOPY;  Service: Endoscopy;   Laterality: N/A;  . Transverse colon resection      FAMILY HISTORY :   Family History  Problem Relation Age of Onset  . Cancer Mother     oral cavity  . CAD Father     SOCIAL HISTORY:   Social History  Substance Use Topics  . Smoking status: Never Smoker   . Smokeless tobacco: Never Used  . Alcohol Use: No    ALLERGIES:  is allergic to penicillins and doxycycline.  MEDICATIONS:  Current Outpatient Prescriptions  Medication Sig Dispense Refill  . acetaminophen (TYLENOL) 500 MG tablet Take 500-1,000 mg by mouth every 6 (six) hours as needed for mild pain or headache.     Marland Kitchen amLODipine (NORVASC) 5 MG tablet Take 5 mg by mouth daily.    . carbidopa-levodopa (SINEMET IR) 25-100 MG per tablet Take 2 tablets by mouth 3 (three) times daily.    . feeding supplement, ENSURE ENLIVE, (ENSURE ENLIVE) LIQD Take 237 mLs by mouth 2 (two) times daily between meals. 237 mL 12  . ferrous sulfate 325 (65 FE) MG tablet Take 650 mg by mouth 2 (two) times daily.     . folic acid (FOLVITE) 1 MG tablet Take 1 mg by mouth daily.    . metoprolol (LOPRESSOR) 50 MG tablet Take 50 mg by mouth 2 (two) times daily.    . pantoprazole (PROTONIX) 40 MG tablet Take 40 mg by mouth daily.     . potassium chloride (K-DUR,KLOR-CON) 10 MEQ tablet Take 1 tablet (10 mEq total) by mouth daily. 30 tablet 0  . vitamin B-12 (CYANOCOBALAMIN) 1000 MCG tablet Take  1,000 mcg by mouth daily.    . Calcium Carb-Cholecalciferol (CALCIUM 600+D3) 600-200 MG-UNIT TABS Take 1 tablet by mouth 2 (two) times daily.     No current facility-administered medications for this visit.    PHYSICAL EXAMINATION: ECOG PERFORMANCE STATUS: 3 - Symptomatic, >50% confined to bed  BP 102/70 mmHg  Pulse 80  Temp(Src) 97.1 F (36.2 C) (Tympanic)  Resp 18  There were no vitals filed for this visit.  GENERAL: Temporal wasting. Alert, no distress and comfortable.   Accompanied by daughter. EYES: Positive for pallor OROPHARYNX: Poor dentition   Bilateral upper and lower extremity swelling. LABORATORY DATA:  I have reviewed the data as listed    Component Value Date/Time   NA 130* 01/10/2015 1401   NA 141 10/05/2012 0342   K 4.1 01/10/2015 1401   K 3.8 10/05/2012 0342   CL 100* 01/10/2015 1401   CL 107 10/05/2012 0342   CO2 27 01/10/2015 1401   CO2 34* 10/05/2012 0342   GLUCOSE 117* 01/10/2015 1401   GLUCOSE 85 10/05/2012 0342   BUN 19 01/10/2015 1401   BUN 14 10/05/2012 0342   CREATININE 0.65 01/10/2015 1401   CREATININE 0.72 10/05/2012 0342   CALCIUM 6.3* 01/10/2015 1401   CALCIUM 8.4* 10/05/2012 0342   PROT 5.1* 12/14/2014 1225   PROT 6.8 10/04/2012 0802   ALBUMIN 1.2* 12/14/2014 1225   ALBUMIN 3.0* 10/04/2012 0802   AST 18 12/14/2014 1225   AST 20 10/04/2012 0802   ALT <5* 12/14/2014 1225   ALT 15 10/04/2012 0802   ALKPHOS 105 12/14/2014 1225   ALKPHOS 101 10/04/2012 0802   BILITOT 0.6 12/14/2014 1225   BILITOT 0.4 10/04/2012 0802   GFRNONAA >60 01/10/2015 1401   GFRNONAA >60 10/05/2012 0342   GFRAA >60 01/10/2015 1401   GFRAA >60 10/05/2012 0342    No results found for: SPEP, UPEP  Lab Results  Component Value Date   WBC 12.0* 01/10/2015   NEUTROABS 10.7* 01/10/2015   HGB 7.7* 01/10/2015   HCT 24.6* 01/10/2015   MCV 85.5 01/10/2015   PLT 225 01/10/2015      Chemistry      Component Value Date/Time   NA 130* 01/10/2015 1401   NA 141 10/05/2012 0342   K 4.1 01/10/2015 1401   K 3.8 10/05/2012 0342   CL 100* 01/10/2015 1401   CL 107 10/05/2012 0342   CO2 27 01/10/2015 1401   CO2 34* 10/05/2012 0342   BUN 19 01/10/2015 1401   BUN 14 10/05/2012 0342   CREATININE 0.65 01/10/2015 1401   CREATININE 0.72 10/05/2012 0342      Component Value Date/Time   CALCIUM 6.3* 01/10/2015 1401   CALCIUM 8.4* 10/05/2012 0342   ALKPHOS 105 12/14/2014 1225   ALKPHOS 101 10/04/2012 0802   AST 18 12/14/2014 1225   AST 20 10/04/2012 0802   ALT <5* 12/14/2014 1225   ALT 15 10/04/2012 0802   BILITOT 0.6  12/14/2014 1225   BILITOT 0.4 10/04/2012 0802       RADIOGRAPHIC STUDIES: I have personally reviewed the radiological images as listed and agreed with the findings in the report. No results found.   ASSESSMENT & PLAN:   # 81 year old female patient with above history of unresectable colon cancer currently in a nursing home is here for follow-up  For palliative reasons radiation was considered; however patient declined. Patient's performance status is ECOG 4. Patient is Jehovah's Witness; she is not interested in chemotherapy. She is interested in  palliation.  # I recommend hospice as I think her life expectancy is less than 6 months. Hospice is recommended at the nursing home. Orders written.  I had a long discussion with the patient and her daughter regarding the above. They agree.  No orders of the defined types were placed in this encounter.   All questions were answered. The patient knows to call the clinic with any problems, questions or concerns. No barriers to learning was detected.  I spent 25 minutes counseling the patient face to face. The total time spent in the appointment was 30 minutes and more than 50% was on counseling and review of test results     Cammie Sickle, MD 01/10/2015 3:24 PM

## 2015-01-10 NOTE — Progress Notes (Signed)
Pt was in hosp. In September and dx with GI hemorrhage with ADA and found to have colon cancer.  Patient and daughter refused surgery and due to her being jehovah's witness no blood products. The patient states that her pain is on left side and and it hurts more when she takes deep breath.  She has swelling to her hands and arms as well as her legs-she has stockings on her legs. The daughter states that nursing is aware at nursing home.  Daughter also asked about visit today and I told her that maybe it is for what can be done about IDA.  She states they had spoke about it in  [facility] and decided it was just a matter of time and palliative care rec: that when pt was ready hospice could be called into SNF.

## 2015-01-10 NOTE — Addendum Note (Signed)
Addended by: Luella Cook on: 01/10/2015 03:50 PM   Modules accepted: Medications

## 2015-01-20 ENCOUNTER — Other Ambulatory Visit: Payer: Medicare Other

## 2015-01-25 DEATH — deceased

## 2015-05-12 ENCOUNTER — Inpatient Hospital Stay: Payer: Medicare Other

## 2015-08-30 ENCOUNTER — Other Ambulatory Visit: Payer: Medicare Other

## 2015-09-01 ENCOUNTER — Ambulatory Visit: Payer: Medicare Other

## 2015-09-01 ENCOUNTER — Ambulatory Visit: Payer: Medicare Other | Admitting: Internal Medicine

## 2016-09-29 IMAGING — CT CT ABD-PELV W/ CM
2 of 5 series · 15 of 46 positions shown, 17 images · IV contrast (omnipaque)
Comparison: None applicable

CLINICAL DATA: Large mass near hepatic flexure? Unable to get scope
beyond this site. Bx's taken. Recommend CT to localize mass/check
for mets. Surgical referral since mass is nearly obstructing.

EXAM:
CT ABDOMEN AND PELVIS WITH CONTRAST
TECHNIQUE: Multidetector CT imaging of the abdomen and pelvis was performed
using the standard protocol following bolus administration of
intravenous contrast.
CONTRAST:  80mL OMNIPAQUE IOHEXOL 300 MG/ML  SOLN

[Series 2: routine abd pel with · axial · 0.75mm/px · z∈[-934,-529]mm · 12 of 91 slices shown, 14 images]
[im 5/91  soft-tissue]
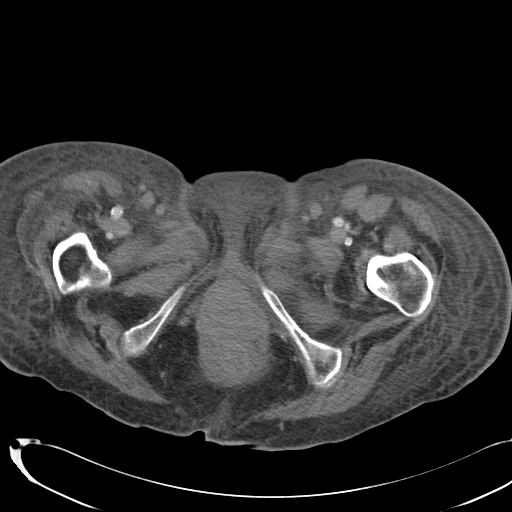
[im 5/91  bone]
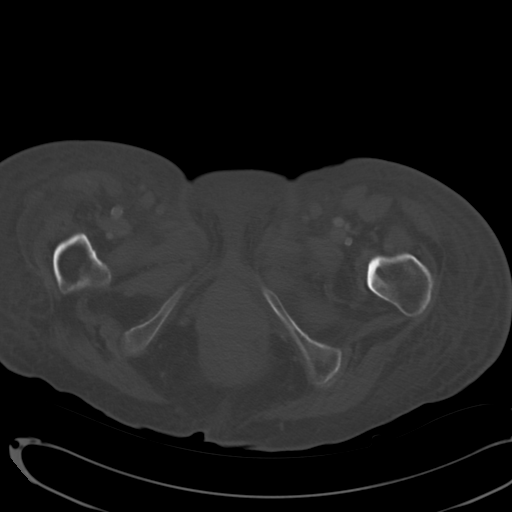
[im 14/91  soft-tissue]
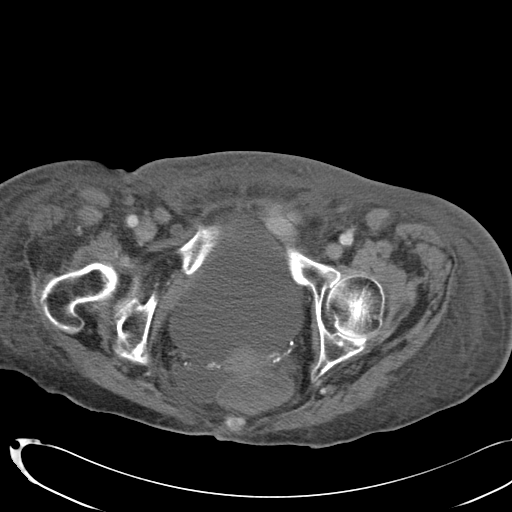
[im 19/91  soft-tissue]
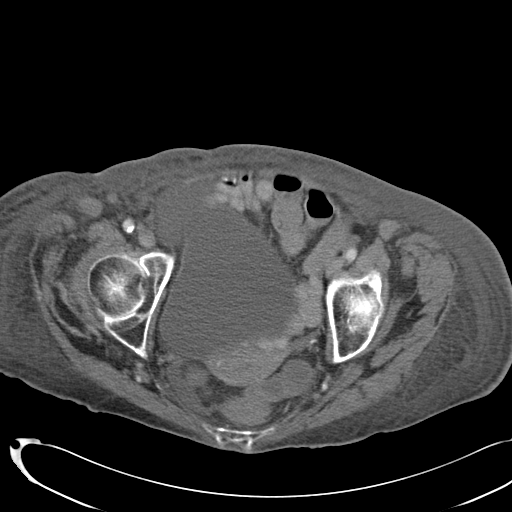
[im 28/91  soft-tissue]
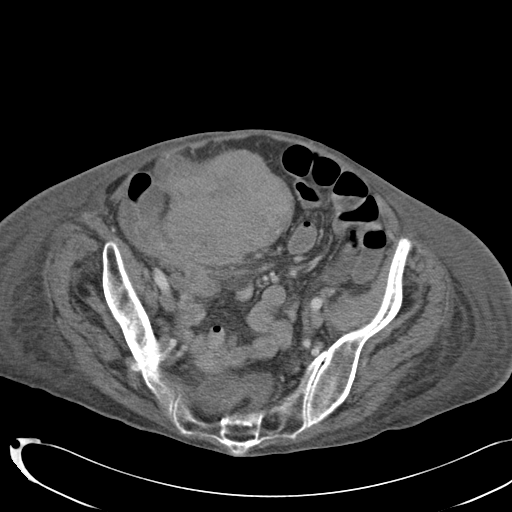
[im 37/91  soft-tissue]
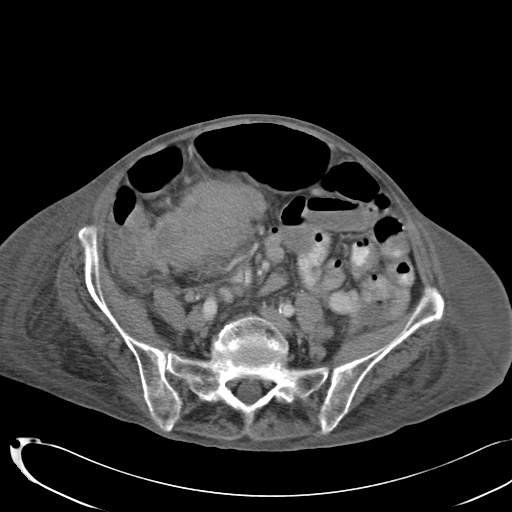
[im 41/91  soft-tissue]
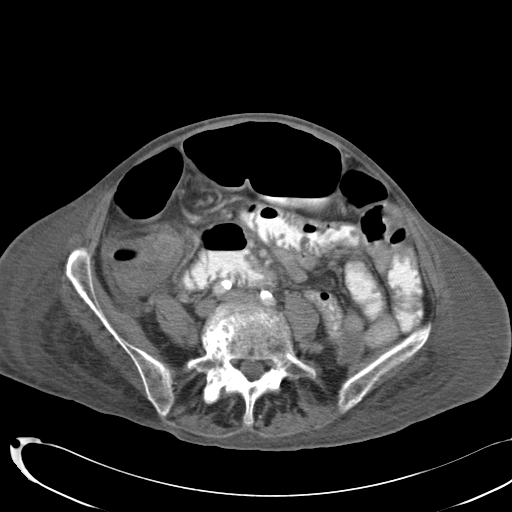
[im 50/91  soft-tissue]
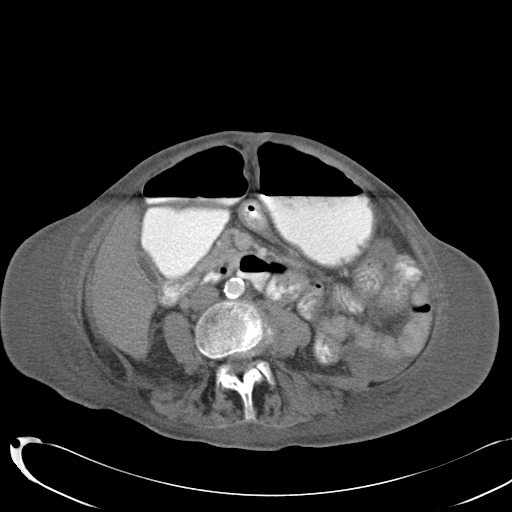
[im 55/91  soft-tissue]
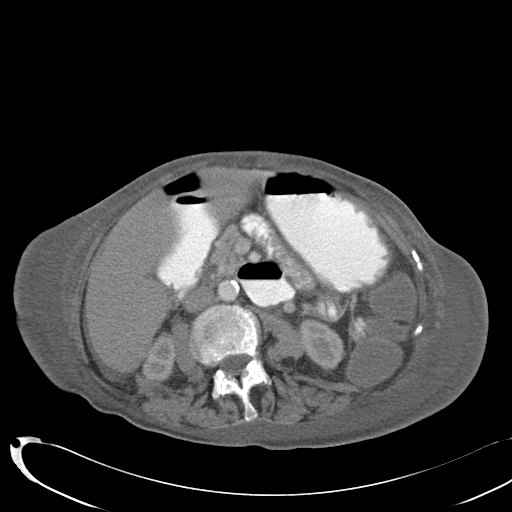
[im 64/91  soft-tissue]
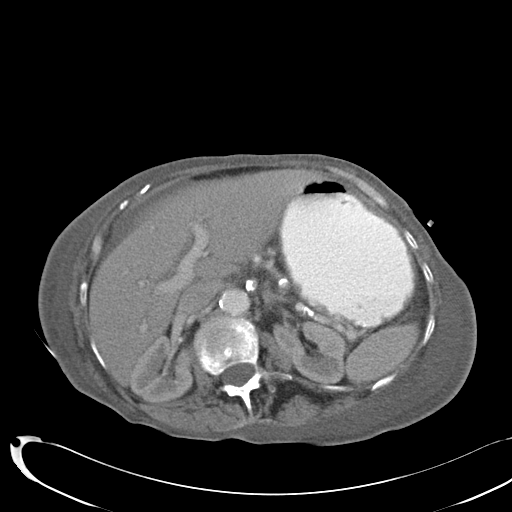
[im 64/91  bone]
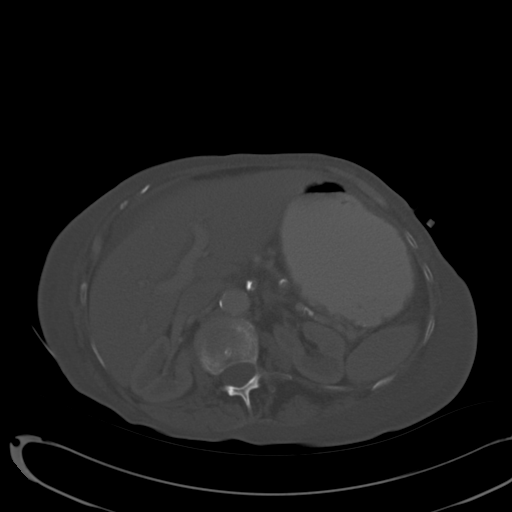
[im 73/91  soft-tissue]
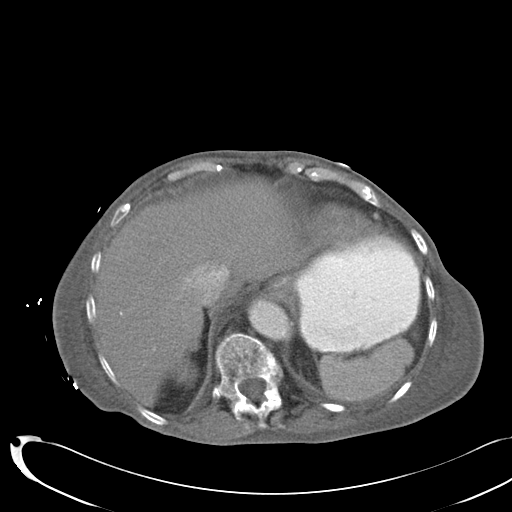
[im 77/91  soft-tissue]
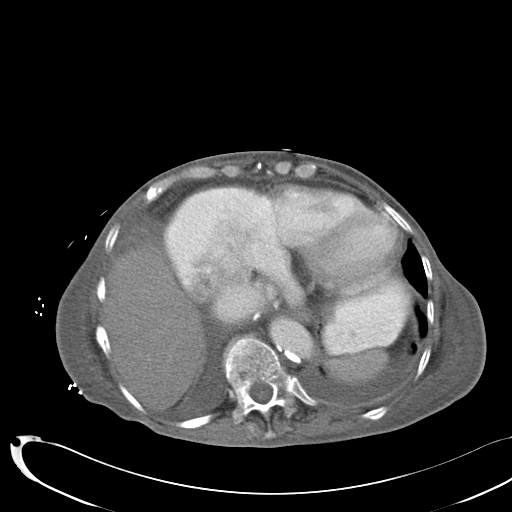
[im 86/91  soft-tissue]
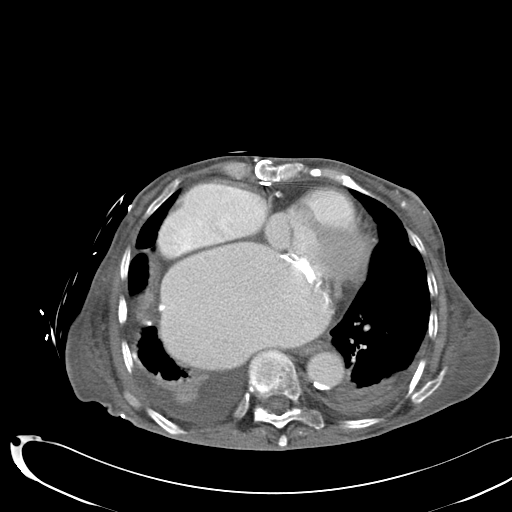

[Series 5: cor routine abd pel with · coronal · 0.73mm/px · 3 of 128 slices shown]
[im 43/128  soft-tissue]
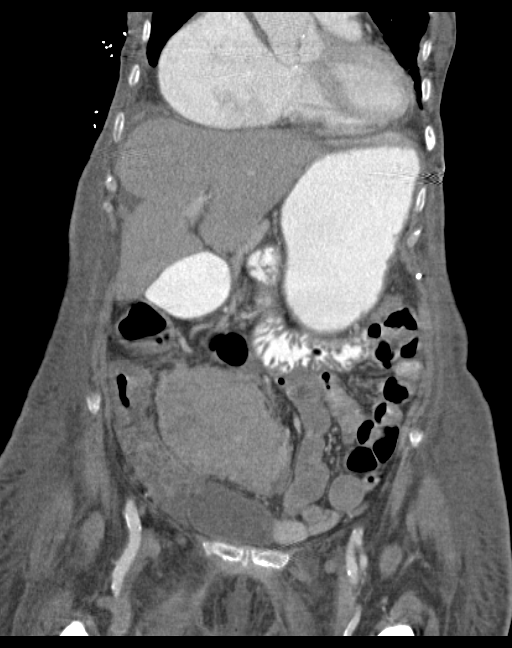
[im 57/128  soft-tissue]
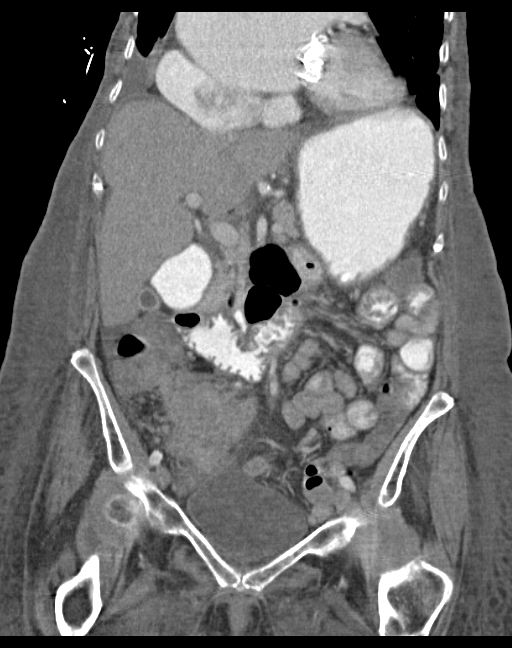
[im 71/128  soft-tissue]
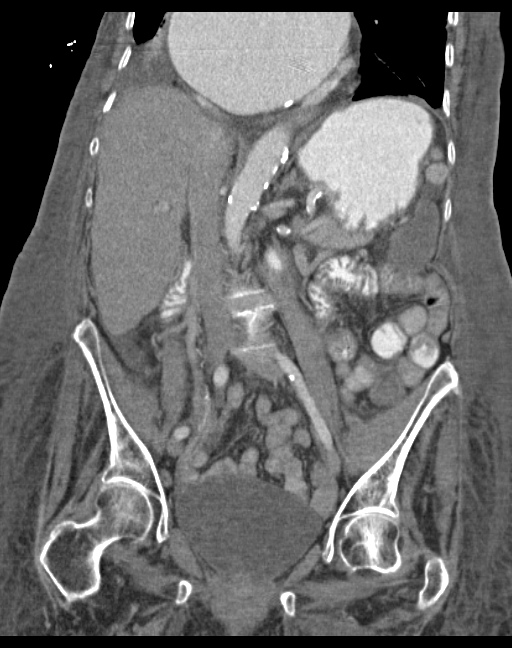

[15 of 46 positions shown; findings below may reference images not displayed]

FINDINGS: Lower chest: The heart is markedly enlarged. There is mitral annulus
repair. The right atrium is markedly dilated. Left atrium is also
dilated. No pericardial effusion. Status post median sternotomy.
Coronary artery calcifications are present. There are bilateral
pleural effusions. Bibasilar atelectasis is present.

Upper abdomen: No focal abnormality identified within the liver,
spleen, pancreas, or adrenal glands. The gallbladder is contracted.

Gastrointestinal tract: Stomach is distended with contrast. The
proximal small bowel loops are normal in appearance. Distal small
bowel loops are normal in appearance. The ascending colon has
thickened wall. The mid transverse colon is involved by a large
solid mass measuring 9.7 x 9.6 x 8.6 cm. The lumen of the colon is
not discernible within this mass. Despite the presence of a large
mass, there is little obstruction proximal to it.

Pelvis: The urinary bladder contains a small amount of air. Question
of recent catheterization? The uterus is present. There is a small
amount of free pelvic fluid. No adnexal mass.

Retroperitoneum: The abdominal aorta is tortuous and densely
calcified. No aneurysm. Small portacaval lymph nodes are identified,
largest measuring 1.1 cm.

Abdominal wall: There is diffuse body wall edema. Status post median
sternotomy.

Osseous structures: No suspicious lytic or blastic lesions are
identified. Scoliosis, convex right
IMPRESSION: 1. Large lobulated mass involving the mid transverse colon,
measuring at least 9.7 cm. Ascending colon has a thickened wall.
However there is little obstruction due to the mass.
2. No evidence for hepatic metastases.
3. Small portacaval lymph nodes.
4. Diffuse body wall edema.
5. Small amount of ascites.
6. Marked enlargement a hard with dilated right atrium and left
atrium.
7. Previous mitral annulus repair.
8. Bilateral pleural effusions, right greater than left.
9. Air within the urinary bladder. Question of recent
catheterization.
10. Scoliosis and degenerative disc disease.
